# Patient Record
Sex: Female | Born: 1945 | Race: Asian | Hispanic: No | Marital: Single | Smoking: Never smoker
Health system: Southern US, Community
[De-identification: ages and names within clinical notes are randomized; demographics above are authoritative.]

## PROBLEM LIST (undated history)

## (undated) DIAGNOSIS — H269 Unspecified cataract: Secondary | ICD-10-CM

## (undated) DIAGNOSIS — Z86010 Personal history of colonic polyps: Secondary | ICD-10-CM

## (undated) DIAGNOSIS — N200 Calculus of kidney: Secondary | ICD-10-CM

## (undated) DIAGNOSIS — M1990 Unspecified osteoarthritis, unspecified site: Secondary | ICD-10-CM

## (undated) DIAGNOSIS — I10 Essential (primary) hypertension: Secondary | ICD-10-CM

## (undated) DIAGNOSIS — E785 Hyperlipidemia, unspecified: Secondary | ICD-10-CM

## (undated) HISTORY — DX: Personal history of colonic polyps: Z86.010

## (undated) HISTORY — PX: ABDOMINAL HYSTERECTOMY: SHX81

## (undated) HISTORY — DX: Essential (primary) hypertension: I10

## (undated) HISTORY — DX: Unspecified osteoarthritis, unspecified site: M19.90

## (undated) HISTORY — DX: Calculus of kidney: N20.0

## (undated) HISTORY — DX: Hyperlipidemia, unspecified: E78.5

## (undated) HISTORY — DX: Unspecified cataract: H26.9

## (undated) HISTORY — PX: COLONOSCOPY: SHX174

---

## 1998-08-26 ENCOUNTER — Other Ambulatory Visit: Admission: RE | Admit: 1998-08-26 | Discharge: 1998-08-26 | Payer: Self-pay | Admitting: *Deleted

## 2000-03-01 ENCOUNTER — Other Ambulatory Visit: Admission: RE | Admit: 2000-03-01 | Discharge: 2000-03-01 | Payer: Self-pay | Admitting: Obstetrics and Gynecology

## 2000-03-14 ENCOUNTER — Encounter: Payer: Self-pay | Admitting: Obstetrics and Gynecology

## 2000-03-21 ENCOUNTER — Encounter (INDEPENDENT_AMBULATORY_CARE_PROVIDER_SITE_OTHER): Payer: Self-pay

## 2000-03-21 ENCOUNTER — Inpatient Hospital Stay (HOSPITAL_COMMUNITY): Admission: RE | Admit: 2000-03-21 | Discharge: 2000-03-24 | Payer: Self-pay | Admitting: Obstetrics and Gynecology

## 2001-09-11 ENCOUNTER — Other Ambulatory Visit: Admission: RE | Admit: 2001-09-11 | Discharge: 2001-09-11 | Payer: Self-pay | Admitting: Obstetrics and Gynecology

## 2003-01-06 ENCOUNTER — Emergency Department (HOSPITAL_COMMUNITY): Admission: EM | Admit: 2003-01-06 | Discharge: 2003-01-06 | Payer: Self-pay

## 2003-01-06 ENCOUNTER — Encounter: Payer: Self-pay | Admitting: Emergency Medicine

## 2003-01-11 ENCOUNTER — Emergency Department (HOSPITAL_COMMUNITY): Admission: EM | Admit: 2003-01-11 | Discharge: 2003-01-11 | Payer: Self-pay | Admitting: Emergency Medicine

## 2010-08-28 NOTE — Discharge Summary (Signed)
[facility]  Patient:    AUDIANNA, LANDGREN [facility]                         MRN: [id number] Proc. Date: 03/21/00 Adm. Date:  [id number] Attending:  Osborn Coho                           Discharge Summary  DISCHARGE DIAGNOSES: 1. Symptomatic prolapse. 2. Symptomatic cystocele.  PROCEDURE: 1. Total vaginal hysterectomy with bilateral salpingo-oophorectomy. 2. Anterior colporrhaphy.  HISTORY OF PRESENT ILLNESS:  Ms. Kasper is a 65 year old female G5, P2-0-3-2 who is menopausal. The patient presented on March 21, 2000 for a total vaginal hysterectomy, bilateral salpingo-oophorectomy, anterior colporrhaphy secondary to a 4 year history of worsening symptoms of uterine prolapse. Approximately 1 year ago, a pessary was placed; however, this caused extreme discomfort and was discontinued. The patient states that the protruding mass occasionally bleeds as it rubs on her underwear. The patient lifts a lot at work. She denies any symptoms of loss or urine.  HOSPITAL COURSE:  The patient was admitted after the total vaginal hysterectomy with bilateral salpingo-oophorectomy and anterior colporrhaphy. For a complete description of this procedure, please see the dictated operative noted. In the postoperative period, the patient developed constipation which was relieved with Dulcolax and a Fleets enema. The patient remained afebrile throughout the entire hospitalization. She was eating a regular diet at the time of discharge. The patient did have 2 voiding trials which were unsuccessful. The patient having post void residuals of greater than 150 cc. The patient was discharged to home on postoperative day #3. She was sent home with a Foley catheter and plug. She was instructed how to use this. Her son was also instructed since there is some language barrier. The patient will remove the Foley catheter Sunday night and come to the office Monday. The patient was discharged  to home on Demerol to take p.r.n. and Macrobid 1 p.o. b.i.d.  CONDITION ON DISCHARGE:  Stable. DD:  03/24/00 TD:  03/24/00 Job: [id number]

## 2010-08-28 NOTE — Op Note (Signed)
[facility]  Patient:    Theresa Duran, Theresa Duran                             MRN: [id number] Proc. Date: 03/21/00 Attending:  Janeece Riggers. Dareen Piano, M.D.                           Operative Report  PREOPERATIVE DIAGNOSIS:  Symptomatic uterine prolapse with cystocele.  POSTOPERATIVE DIAGNOSIS:  Symptomatic uterine prolapse with cystocele.  OPERATION: 1. Total vaginal hysterectomy with bilateral salpingo-oophorectomy. 2. Anterior colporrhaphy.  SURGEON:  Mark E. Dareen Piano, M.D.  ANESTHESIA:  General endotracheal anesthesia.  ANTIBIOTICS:  Ancef 1 g.  DRAINS:  Foley bedside drainage.  ESTIMATED BLOOD LOSS:  150 cc.  COMPLICATIONS:  None.  SPECIMENS:  Uterus, cervix, fallopian tubes, and ovaries sent to pathology.  DESCRIPTION OF PROCEDURE:  The patient was taken to the operating room where a general anesthetic was administered without complications.  She was then placed in the dorsolithotomy position and prepped with Betadine.  She was draped in the usual fashion for the procedure.  Her bladder was drained with a red rubber catheter.  The weighted speculum was placed in the vagina.  A single-tooth tenaculum was applied to the anterior cervical lip.  The cervix was then injected with 1% lidocaine with epinephrine.  The posterior cul-de-sac was entered sharply.  Peritoneal fluid appeared to be clear.  The uterosacral ligaments were then bilaterally clamped, cut, and ligated with 0 Monocryl suture.  Cardinal ligaments were then serially clamped, cut, and ligated with 0 Monocryl suture.  The anterior cul-de-sac was then entered sharply and intestines were identified.  The uterine vessels were then bilaterally clamped, cut, and ligated with O Monocryl suture.  Once the level of the fallopian tube was reached, the uterus was inverted, and the fallopian tube, ovarian ligament, and round ligament were bilaterally clamped, cut, and the uterus and cervix removed.  At this  point, the ovary was identified.  It appeared to be small and normal.  A clamp was then placed over the fallopian tube, infundibulopelvic ligament, and round ligament.  The ovary and fallopian tube were then removed.  This pedicle was then doubly ligated with 0 Monocryl suture.  A similar procedure was performed on the opposite side.  Next, the cul-de-sac was closed using interrupted 0 Vicryl suture and a McCall colpoplasty.  At this point, the anterior vagina was injected with 1% lidocaine with epinephrine.  A vertical incision was made in the anterior vagina.  The cystocele was then dissected away from the vaginal mucosa.  The cystocele was then pushed back into the abdominal cavity, and interrupted 2-0 Vicryl sutures were used to close the defect.  The excess vagina was then removed.  The vagina was then closed using 2-0 Vicryl in a running locking fashion.  The posterior vagina was closed also using 2-0 Vicryl in a running locking fashion.  This concluded the procedure.  A Foley catheter had been previously placed at the conclusion of the hysterectomy prior to the anterior colporrhaphy.  At this point, the patient was taken to the recovery room. Instrument and lap counts correct x 3. DD:  03/21/00 TD:  03/21/00 Job: [id number]

## 2011-08-18 ENCOUNTER — Encounter: Payer: Self-pay | Admitting: Family Medicine

## 2011-08-18 ENCOUNTER — Ambulatory Visit (INDEPENDENT_AMBULATORY_CARE_PROVIDER_SITE_OTHER): Payer: Medicare HMO | Admitting: Family Medicine

## 2011-08-18 ENCOUNTER — Ambulatory Visit: Payer: Self-pay | Admitting: Family Medicine

## 2011-08-18 VITALS — BP 136/80 | HR 72 | Temp 98.6°F | Ht <= 58 in | Wt 154.5 lb

## 2011-08-18 DIAGNOSIS — R252 Cramp and spasm: Secondary | ICD-10-CM | POA: Insufficient documentation

## 2011-08-18 DIAGNOSIS — Z79899 Other long term (current) drug therapy: Secondary | ICD-10-CM

## 2011-08-18 DIAGNOSIS — E785 Hyperlipidemia, unspecified: Secondary | ICD-10-CM

## 2011-08-18 DIAGNOSIS — H269 Unspecified cataract: Secondary | ICD-10-CM | POA: Insufficient documentation

## 2011-08-18 DIAGNOSIS — M545 Low back pain: Secondary | ICD-10-CM

## 2011-08-18 LAB — CK: Total CK: 119 U/L (ref 7–177)

## 2011-08-18 LAB — CBC
HCT: 41.2 % (ref 36.0–46.0)
Hemoglobin: 13.4 g/dL (ref 12.0–15.0)
MCH: 31.5 pg (ref 26.0–34.0)
MCHC: 32.5 g/dL (ref 30.0–36.0)
MCV: 96.7 fL (ref 78.0–100.0)
Platelets: 252 10*3/uL (ref 150–400)
RBC: 4.26 MIL/uL (ref 3.87–5.11)
RDW: 13.4 % (ref 11.5–15.5)
WBC: 4.2 10*3/uL (ref 4.0–10.5)

## 2011-08-18 LAB — LIPID PANEL
Cholesterol: 152 mg/dL (ref 0–200)
HDL: 49 mg/dL (ref >39)
LDL Cholesterol: 77 mg/dL (ref 0–99)
Total CHOL/HDL Ratio: 3.1 Ratio
Triglycerides: 128 mg/dL (ref <150)
VLDL: 26 mg/dL (ref 0–40)

## 2011-08-18 LAB — TSH: TSH: 2.02 u[IU]/mL (ref 0.350–4.500)

## 2011-08-18 LAB — COMPREHENSIVE METABOLIC PANEL
ALT: 22 U/L (ref 0–35)
AST: 24 U/L (ref 0–37)
Albumin: 4.4 g/dL (ref 3.5–5.2)
Alkaline Phosphatase: 56 U/L (ref 39–117)
BUN: 13 mg/dL (ref 6–23)
CO2: 28 mEq/L (ref 19–32)
Calcium: 9.3 mg/dL (ref 8.4–10.5)
Chloride: 106 mEq/L (ref 96–112)
Creat: 0.52 mg/dL (ref 0.50–1.10)
Glucose, Bld: 83 mg/dL (ref 70–99)
Potassium: 3.8 mEq/L (ref 3.5–5.3)
Sodium: 142 mEq/L (ref 135–145)
Total Bilirubin: 0.7 mg/dL (ref 0.3–1.2)
Total Protein: 6.9 g/dL (ref 6.0–8.3)

## 2011-08-18 LAB — MAGNESIUM: Magnesium: 2.2 mg/dL (ref 1.5–2.5)

## 2011-08-18 LAB — POCT GLYCOSYLATED HEMOGLOBIN (HGB A1C): Hemoglobin A1C: 5.9

## 2011-08-18 NOTE — Progress Notes (Signed)
  Subjective:    Patient ID: Theresa Duran, female    DOB: 1945/08/12, 66 y.o.   MRN: [id number]  HPI new patient to establish care. The patient and daughter agree for the daughter to ask as interpreter as the patient speaks [place] primarily. They refuse telephone interpreter.  1. low back pain. Patient notices some right sided low back pain only after standing for many hours at a time. She denies any injury, fall, weakness, numbness, radiation. She has not tried any therapies or medication.  2. calf cramps. For past couple of months patient notices bilateral calf cramps only at night while sleeping. She denies any pain with ambulation or activity. Denies edema, rash, injury.  3. hyperlipidemia. Patient has been taking simvastatin daily. Cannot remember when she started however this was switched from Lipitor previously (due to cost). This was prescribed by an urgent care physician. No history of heart or lung disease appear. Patient has never smoked.  Review of Systems See history of present illness otherwise negative.    Objective:   Physical Exam  Vitals reviewed. Constitutional: She is oriented to person, place, and time. She appears well-developed and well-nourished. No distress.  HENT:  Head: Normocephalic.  Mouth/Throat: Oropharynx is clear and moist.  Eyes: EOM are normal.  Cardiovascular: Normal rate, regular rhythm, normal heart sounds and intact distal pulses.  Exam reveals no gallop.   No murmur heard. Pulmonary/Chest: Effort normal and breath sounds normal. No respiratory distress. She has no wheezes. She has no rales.  Abdominal: Soft. Bowel sounds are normal. She exhibits no distension. There is no tenderness. There is no guarding.  Musculoskeletal: She exhibits no edema and no tenderness.       No tenderness to palpation and spinous processes. Patient has full range of motion with spinal flexion and extension. She has mild pain with bilateral lateral flexion in lumbar  paraspinal area. No rash or swelling. Negative straight leg test negative FABER.   Full range of motion bilateral hips.  Neurological: She is alert and oriented to person, place, and time. No cranial nerve deficit. She exhibits normal muscle tone. Coordination normal.  Skin: No rash noted.  Psychiatric: She has a normal mood and affect. Her behavior is normal.       Assessment & Plan:

## 2011-08-18 NOTE — Assessment & Plan Note (Signed)
Likely musculoskeletal strain vs early lumbar arthropathy. Recommend trial of conservative management with tylenol, motrin prn. No imaging indicated currently.  Discussed red flag symptoms: increased pain, trauma, swelling, fever, weakness, numbness to trigger return to care. May also try PT if not improved.

## 2011-08-18 NOTE — Patient Instructions (Signed)
I think your back pain and calf cramps are from muscle spasm. You can try to take tylenol (acetaminophen) 650 mg 4 times a day. You can also try motrin (ibuprofen) up to 600mg  3 times a day for up to one week. If your pain worsens, you have weakness, swelling, falls or this does not go away then come back sooner. Will send you for a mammogram. I will call if lab tests are abnormal, or send a letter if everything is ok. You may try to take magnesium for calf cramps.  Make an appointment in 2-3 months for check up.  Cholesterol Cholesterol is a type of fat. Your body needs a small amount of cholesterol, but too much can cause health problems. Certain problems include heart attacks, strokes, and not enough blood flow to your heart, brain, kidneys, or feet. You get cholesterol in 2 ways:  Naturally.   By eating certain foods.  HOME CARE  Eat a low-fat diet:   Eat less eggs, whole dairy products (whole milk, cheese, and butter), fatty meats, and fried foods.   Eat more fruits, vegetables, whole-wheat breads, lean chicken, and fish.   Follow your exercise program as told by your doctor.   Keep your weight at a healthy level. Talk to your doctor about what is right for you.   Only take medicine as told by your doctor.   Get your cholesterol checked once a year or as told by your doctor.  MAKE SURE YOU:  Understand these instructions.   Will watch your condition.   Will get help right away if you are not doing well or get worse.  Document Released: 06/25/2008 Document Revised: 03/18/2011 Document Reviewed: 06/25/2008 [facility] Patient Information 2012 [place], [place].

## 2011-08-18 NOTE — Assessment & Plan Note (Signed)
Will check FLP today. Continue statin for now. Will check CK or consider trial off statin if muscle cramps continue.

## 2011-08-18 NOTE — Assessment & Plan Note (Signed)
Will check electrolytes, CK to rule out myopathy. Advised empiric trial of magnesium. May try off statin.

## 2011-08-19 ENCOUNTER — Encounter: Payer: Self-pay | Admitting: Family Medicine

## 2011-10-25 ENCOUNTER — Encounter: Payer: Self-pay | Admitting: Family Medicine

## 2011-10-25 ENCOUNTER — Ambulatory Visit (INDEPENDENT_AMBULATORY_CARE_PROVIDER_SITE_OTHER): Payer: Medicare HMO | Admitting: Family Medicine

## 2011-10-25 VITALS — BP 138/82 | HR 85 | Temp 98.6°F | Ht <= 58 in | Wt 149.0 lb

## 2011-10-25 DIAGNOSIS — J069 Acute upper respiratory infection, unspecified: Secondary | ICD-10-CM

## 2011-10-25 MED ORDER — HYDROCOD POLST-CHLORPHEN POLST 10-8 MG/5ML PO LQCR: 5.0000 mL | Freq: Two times a day (BID) | ORAL | Status: DC | PRN

## 2011-10-25 MED ORDER — HYDROCODONE-HOMATROPINE 5-1.5 MG/5ML PO SYRP: 5.0000 mL | ORAL_SOLUTION | Freq: Three times a day (TID) | ORAL | Status: AC | PRN

## 2011-10-25 NOTE — Assessment & Plan Note (Signed)
Viral uri.  Rx hycodan cough syrup.  Discussed typical course and red flags for return.

## 2011-10-25 NOTE — Progress Notes (Signed)
  Subjective:    Patient ID: Theresa Duran, female    DOB: November 09, 1945, 66 y.o.   MRN: [id number]  HPI 5 days of cough  Associated with runny nose, headache, sore throat.   Productive yellow-green cough.  No dyspnea.  No fever, chills. Taking OT dextromethorphan without improvement.    I have reviewed patient's  PMH, FH, and Social history and Medications as related to this visit. No smoking, no history of immunocompromise, recent hospitalizations, or lung illnesses. Review of Systems See HPI    Objective:   Physical Exam GEN: Alert & Oriented, No acute distress, here with friend, used phone [place] interpreter HEENT: [place]/AT. EOMI, PERRLA, no conjunctival injection or scleral icterus..  Oropharynx is without erythema or exudates.  No anterior or posterior cervical lymphadenopathy. CV:  Regular Rate & Rhythm, no murmur Respiratory:  Normal work of breathing, CTAB         Assessment & Plan:

## 2011-10-25 NOTE — Progress Notes (Signed)
Subjective:     Patient ID: Theresa Duran, female   DOB: [birth year], 66 y.o.   MRN: [id number]  HPI Theresa Duran is a 66 y/o woman with a history of hyerlipidemia who comes to clinic today with a 5 day history of cough. Visit conducted through a [place] interpreter.   She says the cough started last Wednesday, and has continued since then. She states that she has coughed so much it has given her a headache. When she coughs, she also feels a pain in the middle of her chest. She says that her throat also feels sore.   She tried Nyquil cough syrups, but they have not helped.   She has no other complaints today, and denies fever, shortness of breath, and other associated symptoms.   Review of Systems As per above.     Objective:   Physical Exam General: No acute distress HEENT: MMM, oral mucosa is non-erythematous, there is no exudate. Neck is supple and without lymphadenopathy Pulm: Normal effort of breathing, lungs clear to auscultation bilaterally CV: RRR, no murmurs, rubs, or gallips.     Assessment:  1. 66 y/o woman with a 5 day history of cough. Most likely a viral URI. Absence of fever, systemic symptoms, and exudate make bacterial cause more unlikely.  Plan:  1. Cough: Will give prescription for hydrocodone-homatropine cough syrup for symptom control. Instructed to return if cough does not resolve in the next week or so, or if she develops fever and/or shortness of breath.

## 2011-10-25 NOTE — Patient Instructions (Addendum)
Use cough medicine as needed Return if you have shortness of breath, fever, or other concerns

## 2011-10-26 ENCOUNTER — Telehealth: Payer: Self-pay | Admitting: *Deleted

## 2011-10-26 NOTE — Telephone Encounter (Signed)
Patient notified

## 2011-10-26 NOTE — Telephone Encounter (Signed)
PA denied.  Information regarding denial given to Dr. Earnest Bailey.

## 2011-10-26 NOTE — Telephone Encounter (Signed)
Pharmacy states Hycodan not covered by insurance. I called [employer] and was told PA could be done. Form placed in MD box.

## 2011-10-26 NOTE — Telephone Encounter (Signed)
Please call patient and let her know medicare does not cover any cough medicines.

## 2011-10-28 ENCOUNTER — Telehealth: Payer: Self-pay | Admitting: Family Medicine

## 2011-10-28 NOTE — Telephone Encounter (Signed)
Need to speak with provider regarding cough med that was prescribed for her.  Daughter said that the medicine is not working, mother still coughing all day and all night.  Have a lot of mucous buildup.  Please call her daughter back to discuss other options.

## 2011-10-29 MED ORDER — AZITHROMYCIN 250 MG PO TABS: ORAL_TABLET | ORAL | Status: AC

## 2011-10-29 NOTE — Telephone Encounter (Signed)
Please let patient know i have sent in antibiotic azithromycin for her.

## 2011-10-29 NOTE — Telephone Encounter (Signed)
I have not evaluated patient, will route to Dr. Earnest Bailey for advice.

## 2011-11-08 NOTE — Telephone Encounter (Signed)
Spoke with daughter and appointment is scheduled for tomorrow AM for recheck . Daughter ask that she be called during visit because the daughter that is going to bring her does not speak [place]. Phone # [phone]  . [place]  interpretor has been scheduled.

## 2011-11-08 NOTE — Telephone Encounter (Signed)
Pt finished abx and she was getting better, but now the cough is back and worse than before.  Wants to know what to do.

## 2011-11-09 ENCOUNTER — Encounter: Payer: Self-pay | Admitting: Family Medicine

## 2011-11-09 ENCOUNTER — Ambulatory Visit (INDEPENDENT_AMBULATORY_CARE_PROVIDER_SITE_OTHER): Payer: Medicare HMO | Admitting: Family Medicine

## 2011-11-09 VITALS — BP 158/86 | Temp 98.0°F | Wt 150.0 lb

## 2011-11-09 DIAGNOSIS — R05 Cough: Secondary | ICD-10-CM

## 2011-11-09 MED ORDER — BENZONATATE 100 MG PO CAPS: 100.0000 mg | ORAL_CAPSULE | Freq: Three times a day (TID) | ORAL | Status: DC | PRN

## 2011-11-09 NOTE — Assessment & Plan Note (Signed)
Cough most likely post-viral with an element of post nasal drainage per her history.  Advised antihistamine and nasal saline irrigation.  Will rx tessalon for symptomatic treatment.  Discussed to return for dyspnea, fever or prolonged for 6 weeks to return for further evaluation for chronic cough at that point.

## 2011-11-09 NOTE — Progress Notes (Signed)
  Subjective:    Patient ID: Theresa Duran, female    DOB: 1946-01-11, 66 y.o.   MRN: [id number]  HPI  Follow-up for continued cough.  Was seen on 7/15 for 6 days of cough.  Was rxed hydrocodone cough syrup.  Did not helped.  Then rxed empiric azithromycin.  Continued coughing.  occ mucous.  No fever, chills, dyspnea.  No improvement. Feels a tickle in her throat.  ? Some drainage.  No reflux of sour taste in mouth.  No itching sneezing.  I have reviewed patient's  PMH, FH, and Social history and Medications as related to this visit.  Review of Systems ssee hPI    Objective:   Physical Exam GEN: Alert & Oriented, No acute distress, [place] interpretr in room CV:  Regular Rate & Rhythm, no murmur Respiratory:  Normal work of breathing, CTAB Abd:  + BS, soft, no tenderness to palpation Ext: no pre-tibial edema        Assessment & Plan:

## 2011-11-09 NOTE — Patient Instructions (Addendum)
Your cough is leftover cough from your cold  You do not have signs of pneumonia  Tessalon is a cough medicine, may not be covered by medicare  Most coughs will resolve by 6 weeks, if yours continues, please come back for recheck  Drainage in back of throat can cause tickel and cough:  Try allergy medicine such as zyrtec or claritin  Can try sinus rinse such as neti pot or neilmed sinus rinse. (kits available at drugstore)

## 2011-12-27 ENCOUNTER — Other Ambulatory Visit: Payer: Self-pay | Admitting: Family Medicine

## 2011-12-28 ENCOUNTER — Ambulatory Visit (HOSPITAL_COMMUNITY)
Admission: RE | Admit: 2011-12-28 | Discharge: 2011-12-28 | Disposition: A | Discharge status: Home / Self Care | Payer: Medicare HMO | Source: Ambulatory Visit | Attending: Family Medicine | Admitting: Family Medicine

## 2011-12-28 ENCOUNTER — Ambulatory Visit (INDEPENDENT_AMBULATORY_CARE_PROVIDER_SITE_OTHER): Payer: Medicare HMO | Admitting: Family Medicine

## 2011-12-28 VITALS — BP 152/85 | HR 85 | Temp 98.1°F | Wt 147.0 lb

## 2011-12-28 DIAGNOSIS — M7989 Other specified soft tissue disorders: Secondary | ICD-10-CM | POA: Insufficient documentation

## 2011-12-28 DIAGNOSIS — R82998 Other abnormal findings in urine: Secondary | ICD-10-CM

## 2011-12-28 DIAGNOSIS — M25562 Pain in left knee: Secondary | ICD-10-CM

## 2011-12-28 DIAGNOSIS — M1712 Unilateral primary osteoarthritis, left knee: Secondary | ICD-10-CM | POA: Insufficient documentation

## 2011-12-28 DIAGNOSIS — M25569 Pain in unspecified knee: Secondary | ICD-10-CM

## 2011-12-28 DIAGNOSIS — R300 Dysuria: Secondary | ICD-10-CM

## 2011-12-28 LAB — POCT URINALYSIS DIPSTICK
Protein, UA: 30
pH, UA: 6

## 2011-12-28 LAB — POCT SEDIMENTATION RATE: POCT SED RATE: 24 mm/hr — AB (ref 0–22)

## 2011-12-28 LAB — POCT UA - MICROSCOPIC ONLY

## 2011-12-28 LAB — URIC ACID: Uric Acid, Serum: 5.9 mg/dL (ref 2.4–7.0)

## 2011-12-28 MED ORDER — DICLOFENAC SODIUM 75 MG PO TBEC: 75.0000 mg | DELAYED_RELEASE_TABLET | Freq: Two times a day (BID) | ORAL | Status: DC

## 2011-12-28 NOTE — Assessment & Plan Note (Signed)
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 Unilateral pain concerning for possible gouty arthritis. rheumatoid arthritis unlikely in the setting of monoarticular arthritis with no systemic complaints.  P 1. L knee  x-ray 2. Start diclofenac one tab twice daily to reduce inflammation: up to two weeks. Drink plenty of water with this.  3. Labs uric acid and sed rate.  4. ice and elevation when resting.  5. If x-ray confirm arthritis I recommend steroid injection this treatment option was discussed with the patient.

## 2011-12-28 NOTE — Assessment & Plan Note (Signed)
A: calcium oxalate crystals in urine. This is concerning for the potential for stones. No symptoms concerning for UTI.  P: -liberal hydration -advised patient to contact us if she develop urinary symptoms or flank pain.

## 2011-12-28 NOTE — Progress Notes (Signed)
Subjective:     Patient ID: Theresa Duran, female   DOB: 1945/04/21, 66 y.o.   MRN: [id number]  HPI History obtained from patient with help of interpreter. Patient spoke some English during the exam.   # L knee pain: x 2 weeks. No inciting injury. Has had similar pain before but not as bad. Pain in lateral upper knee and medial lower knee with intermittent swelling. No redness. Aggravating factors included prolonged standing > 1 hr, walking long distances, knee flexion (walking up stairs). Relieving factors rest only. Patient tried heat and tylenol without relief. No pain in R knee.   # yellow urine: noticed yellow urine. Drinks plenty of water. Denies dysuria, frequency and urgency and incontinence. Denies flank pain and fever.   Review of Systems As per HPI     Objective:   Physical Exam BP 152/85  Pulse 85  Temp 98.1 F (36.7 C) (Oral)  Wt 147 lb (66.679 kg) General appearance: alert, cooperative and no distress Extremities:  Knee: Normal to inspection with no erythema or effusion or obvious bony abnormalities. Palpation positive for superior lateral and inferior medial condyle and joint line tenderness. No warmth or patellar tenderness. ROM normal in flexion and extension and lower leg rotation. Ligaments with solid consistent endpoints including ACL, PCL, LCL, MCL. Negative  provocative meniscal tests. Non painful patellar compression. Patellar and quadriceps tendons unremarkable. Hamstring and quadriceps strength is normal. Patient able to squat with pain.  Gait with slight limp.     Assessment and Plan:

## 2011-12-28 NOTE — Patient Instructions (Addendum)
Theresa Duran,  Thank you for coming in to see me today.  For your knee pain: 1. Go for x-rays 2. Start diclofenac one tab twice daily to reduce inflammation: up to two weeks. Drink plenty of water with this.  3. I will send blood work results.  4. Try ice and elevation when rest.    For dark urine. There are crystal in your urine.  1. Drink plenty of water. 8 oz 8 time per days.  2. Please f/u if you develop pain in side or pain with urination.  F/u with Dr. Cristal Ford in two weeks.   Dr. Armen Pickup

## 2012-01-12 ENCOUNTER — Telehealth: Payer: Self-pay | Admitting: Family Medicine

## 2012-01-12 NOTE — Telephone Encounter (Signed)
Daughter is calling to let Dr. Cristal Ford know that the pain medicine for the patients knee pain is not helping her at all.  The patient is scheduled to be seen this Friday, 10/4, and her daughter wanted to make Dr. Cristal Ford aware of this prior to the appt, that the daughter will not be coming to.

## 2012-01-14 ENCOUNTER — Encounter: Payer: Self-pay | Admitting: Family Medicine

## 2012-01-14 ENCOUNTER — Ambulatory Visit (INDEPENDENT_AMBULATORY_CARE_PROVIDER_SITE_OTHER): Payer: Medicare HMO | Admitting: Family Medicine

## 2012-01-14 VITALS — BP 173/97 | HR 95 | Temp 98.1°F | Ht <= 58 in | Wt 151.0 lb

## 2012-01-14 DIAGNOSIS — M25562 Pain in left knee: Secondary | ICD-10-CM

## 2012-01-14 DIAGNOSIS — I10 Essential (primary) hypertension: Secondary | ICD-10-CM | POA: Insufficient documentation

## 2012-01-14 DIAGNOSIS — M25569 Pain in unspecified knee: Secondary | ICD-10-CM

## 2012-01-14 DIAGNOSIS — R030 Elevated blood-pressure reading, without diagnosis of hypertension: Secondary | ICD-10-CM

## 2012-01-14 MED ORDER — TRAMADOL HCL 50 MG PO TABS: 50.0000 mg | ORAL_TABLET | Freq: Three times a day (TID) | ORAL | Status: DC | PRN

## 2012-01-14 NOTE — Assessment & Plan Note (Signed)
Discussed importance of f/u after off NSAIDs and hopefully pain improved.

## 2012-01-14 NOTE — Progress Notes (Signed)
  Subjective:    Patient ID: Theresa Duran, female    DOB: 1945-06-05, 66 y.o.   MRN: [id number]  HPI  1. Left knee pain. F/u today. Still having intermittent left knee pain and swelling. This improves with rest and elevation. Feels pain with standing or walking for long periods. Rated 7/10 at worst. The voltaren helps mildly but fears it has raised her BP some. I reviewed her labs and plain films showing normal uric acid, CBC, ESR and tricompartmental spurring including patellar (where pain is the worst). She denies any recent falls or injuries. Pain worst at lateral patellar region and medial joint line. No effusion today because she has been resting.  Denies fever, chills, redness, calf pain, other joint involvement.   No history of knee surgeries.  Review of Systems See HPI otherwise negative.  reports that she has never smoked. She does not have any smokeless tobacco history on file.    Objective:   Physical Exam  Constitutional: She is oriented to person, place, and time. She appears well-developed and well-nourished. No distress.  HENT:  Head: Normocephalic and atraumatic.  Eyes: EOM are normal.  Cardiovascular: Normal rate, regular rhythm and normal heart sounds.   No murmur heard. Pulmonary/Chest: Effort normal and breath sounds normal. No respiratory distress. She has no wheezes.  Musculoskeletal:       No effusion apparent. Full extension ROM bilaterally. -10 degree left knee flexion deficit. Patella has decreased mobility. Medial joint line TTP and suprapatellar tenderness.  No redness or warmth.   Neurological: She is alert and oriented to person, place, and time.       Gait mildly antalgic.  Skin: She is not diaphoretic.  Psychiatric: She has a normal mood and affect.        Assessment & Plan:

## 2012-01-14 NOTE — Patient Instructions (Addendum)
Please stop taking voltaren. This may have made the blood pressure higher. You can take tylenol (acetaminophen) up to 4 times daily. You can try tramadol as needed. Will send to pharmacy. Ice the knee for next 2 days. Please return for check up in 2-3 weeks for blood pressure check, or sooner if needed. Continue walking and activities as much as possible.  Knee Injection Joint injections are shots. Your caregiver will place a needle into your knee joint. The needle is used to put medicine into the joint. These shots can be used to help treat different painful knee conditions such as osteoarthritis, bursitis, local flare-ups of rheumatoid arthritis, and pseudogout. Anti-inflammatory medicines such as corticosteroids and anesthetics are the most common medicines used for joint and soft tissue injections.  PROCEDURE  The skin over the kneecap will be cleaned with an antiseptic solution.  Your caregiver will inject a small amount of a local anesthetic (a medicine like Novocaine) just under the skin in the area that was cleaned.  After the area becomes numb, a second injection is done. This second injection usually includes an anesthetic and an anti-inflammatory medicine called a steroid or cortisone. The needle is carefully placed in between the kneecap and the knee, and the medicine is injected into the joint space.  After the injection is done, the needle is removed. Your caregiver may place a bandage over the injection site. The whole procedure takes no more than a couple of minutes. BEFORE THE PROCEDURE  Wash all of the skin around the entire knee area. Try to remove any loose, scaling skin. There is no other specific preparation necessary unless advised otherwise by your caregiver. LET YOUR CAREGIVER KNOW ABOUT:   Allergies.  Medications taken including herbs, eye drops, over the counter medications, and creams.  Use of steroids (by mouth or creams).  Possible pregnancy, if  applicable.  Previous problems with anesthetics or Novocaine.  History of blood clots (thrombophlebitis).  History of bleeding or blood problems.  Previous surgery.  Other health problems. RISKS AND COMPLICATIONS Side effects from cortisone shots are rare. They include:   Slight bruising of the skin.  Shrinkage of the normal fatty tissue under the skin where the shot was given.  Increase in pain after the shot.  Infection.  Weakening of tendons or tendon rupture.  Allergic reaction to the medicine.  Diabetics may have a temporary increase in their blood sugar after a shot.  Cortisone can temporarily weaken the immune system. While receiving these shots, you should not get certain vaccines. Also, avoid contact with anyone who has chickenpox or measles. Especially if you have never had these diseases or have not been previously immunized. Your immune system may not be strong enough to fight off the infection while the cortisone is in your system. AFTER THE PROCEDURE   You can go home after the procedure.  You may need to put ice on the joint 15 to 20 minutes every 3 or 4 hours until the pain goes away.  You may need to put an elastic bandage on the joint. HOME CARE INSTRUCTIONS   Only take over-the-counter or prescription medicines for pain, discomfort, or fever as directed by your caregiver.  You should avoid stressing the joint. Unless advised otherwise, avoid activities that put a lot of pressure on a knee joint, such as:  Jogging.  Bicycling.  Recreational climbing.  Hiking.  Laying down and elevating the leg/knee above the level of your heart can help to minimize swelling.  SEEK MEDICAL CARE IF:   You have repeated or worsening swelling.  There is drainage from the puncture area.  You develop red streaking that extends above or below the site where the needle was inserted. SEEK IMMEDIATE MEDICAL CARE IF:   You develop a fever.  You have pain that gets  worse even though you are taking pain medicine.  The area is red and warm, and you have trouble moving the joint. MAKE SURE YOU:   Understand these instructions.  Will watch your condition.  Will get help right away if you are not doing well or get worse. Document Released: 06/20/2006 Document Revised: 06/21/2011 Document Reviewed: 03/17/2007 [facility] Patient Information 2013 [place], [place].

## 2012-01-14 NOTE — Assessment & Plan Note (Signed)
Most likely this is osteoarthritis with waxing and waning symptoms. No red flags or lab workup to suggest infectious or inflammatory arthritis today in absence of significant effusion. Performed medrol knee injection. Advised she stop voltaren and restart tylenol and sent new tramadol prn for pain. Advised to f/u in 2 weeks for BP recheck.    After written consent was obtained, using sterile technique the left knee was prepped with betadine and alcohol.  The knee joint entered with a 22 gauge from the medial infrapatellar approach. Medrol 40 mg and 4 ml plain Lidocaine was then injected and the needle withdrawn.  The procedure was well tolerated.  The patient is asked to continue to rest the knee for a few more days before resuming regular activities.  It may be more painful for the first 1-2 days.  Watch for fever, or increased swelling or persistent pain in knee. Call or return to clinic prn if such symptoms occur or the knee fails to improve as anticipated.

## 2012-01-28 ENCOUNTER — Ambulatory Visit: Payer: Medicare HMO | Admitting: Family Medicine

## 2012-04-13 ENCOUNTER — Other Ambulatory Visit: Payer: Self-pay | Admitting: Physician Assistant

## 2012-04-14 NOTE — Telephone Encounter (Signed)
Chart pulled to PA pool at nurses station [phone]

## 2012-04-17 ENCOUNTER — Other Ambulatory Visit: Payer: Self-pay | Admitting: Family Medicine

## 2012-04-17 ENCOUNTER — Telehealth: Payer: Self-pay | Admitting: Family Medicine

## 2012-04-17 MED ORDER — SIMVASTATIN 40 MG PO TABS: 40.0000 mg | ORAL_TABLET | Freq: Every evening | ORAL | Status: DC

## 2012-04-17 NOTE — Telephone Encounter (Signed)
Request sent to Dr. Tye Savoy.

## 2012-04-17 NOTE — Telephone Encounter (Signed)
Sent in

## 2012-04-17 NOTE — Telephone Encounter (Signed)
Is now out of her simvastatin and pharmacy sent request to [facility] instead of here last week.  Wants to know if this can be called in.  [facility]- [place]

## 2012-05-02 ENCOUNTER — Telehealth: Payer: Self-pay | Admitting: Family Medicine

## 2012-05-02 NOTE — Telephone Encounter (Signed)
I do not even have synthroid or hypothyroidism on her problem list or history. This is a [facility]. PLease have them schedule an appointment prior to getting refills.

## 2012-05-02 NOTE — Telephone Encounter (Signed)
Pt is asking to change her synthroid amount to 90 at a time - insurance will pay better for this

## 2012-05-03 ENCOUNTER — Other Ambulatory Visit: Payer: Self-pay | Admitting: Family Medicine

## 2012-05-03 MED ORDER — SIMVASTATIN 40 MG PO TABS: 40.0000 mg | ORAL_TABLET | Freq: Every evening | ORAL | Status: DC

## 2012-05-03 NOTE — Telephone Encounter (Signed)
I am sorry - I am mistaken.  It was simvastatin not synthroid!!  Sorry!

## 2012-05-03 NOTE — Telephone Encounter (Signed)
I have sent 90 day supply of simvastatin to [facility].

## 2012-09-10 ENCOUNTER — Other Ambulatory Visit: Payer: Self-pay | Admitting: Family Medicine

## 2012-12-12 ENCOUNTER — Ambulatory Visit (INDEPENDENT_AMBULATORY_CARE_PROVIDER_SITE_OTHER): Payer: Medicare HMO | Admitting: Family Medicine

## 2012-12-12 ENCOUNTER — Encounter: Payer: Self-pay | Admitting: Family Medicine

## 2012-12-12 ENCOUNTER — Ambulatory Visit (HOSPITAL_COMMUNITY)
Admission: RE | Admit: 2012-12-12 | Discharge: 2012-12-12 | Disposition: A | Discharge status: Home / Self Care | Payer: Medicare HMO | Source: Ambulatory Visit | Attending: Family Medicine | Admitting: Family Medicine

## 2012-12-12 VITALS — BP 152/82 | HR 86 | Temp 98.6°F | Ht <= 58 in | Wt 149.0 lb

## 2012-12-12 DIAGNOSIS — M48061 Spinal stenosis, lumbar region without neurogenic claudication: Secondary | ICD-10-CM

## 2012-12-12 DIAGNOSIS — M25562 Pain in left knee: Secondary | ICD-10-CM

## 2012-12-12 DIAGNOSIS — M25569 Pain in unspecified knee: Secondary | ICD-10-CM

## 2012-12-12 DIAGNOSIS — Z0000 Encounter for general adult medical examination without abnormal findings: Secondary | ICD-10-CM

## 2012-12-12 DIAGNOSIS — M549 Dorsalgia, unspecified: Secondary | ICD-10-CM

## 2012-12-12 DIAGNOSIS — E785 Hyperlipidemia, unspecified: Secondary | ICD-10-CM

## 2012-12-12 DIAGNOSIS — M545 Low back pain, unspecified: Secondary | ICD-10-CM | POA: Insufficient documentation

## 2012-12-12 MED ORDER — SIMVASTATIN 40 MG PO TABS: 40.0000 mg | ORAL_TABLET | Freq: Every evening | ORAL | Status: DC

## 2012-12-12 MED ORDER — TRAMADOL HCL 50 MG PO TABS: 50.0000 mg | ORAL_TABLET | Freq: Four times a day (QID) | ORAL | Status: DC | PRN

## 2012-12-12 MED ORDER — ACETAMINOPHEN 500 MG PO TABS: 500.0000 mg | ORAL_TABLET | Freq: Four times a day (QID) | ORAL | Status: DC | PRN

## 2012-12-12 NOTE — Patient Instructions (Addendum)
It was nice to meet you Go to the hospital to get an x-ray of your back You will also get a study called a DEXA scan which looks for weak bones, or osteoporosis Take tylenol 500mg  every 6 hours for pain Also take Ultram (tramadol) 50mg  every 8 hours as needed for pain.  Return to clinic for a physical in 3 months.       Back Pain, Adult Low back pain is very common. About 1 in 5 people have back pain.The cause of low back pain is rarely dangerous. The pain often gets better over time.About half of people with a sudden onset of back pain feel better in just 2 weeks. About 8 in 10 people feel better by 6 weeks.  CAUSES Some common causes of back pain include:  Strain of the muscles or ligaments supporting the spine.  Wear and tear (degeneration) of the spinal discs.  Arthritis.  Direct injury to the back. DIAGNOSIS Most of the time, the direct cause of low back pain is not known.However, back pain can be treated effectively even when the exact cause of the pain is unknown.Answering your caregiver's questions about your overall health and symptoms is one of the most accurate ways to make sure the cause of your pain is not dangerous. If your caregiver needs more information, he or she may order lab work or imaging tests (X-rays or MRIs).However, even if imaging tests show changes in your back, this usually does not require surgery. HOME CARE INSTRUCTIONS For many people, back pain returns.Since low back pain is rarely dangerous, it is often a condition that people can learn to [facility] their own.   Remain active. It is stressful on the back to sit or stand in one place. Do not sit, drive, or stand in one place for more than 30 minutes at a time. Take short walks on level surfaces as soon as pain allows.Try to increase the length of time you walk each day.  Do not stay in bed.Resting more than 1 or 2 days can delay your recovery.  Do not avoid exercise or work.Your body is made  to move.It is not dangerous to be active, even though your back may hurt.Your back will likely heal faster if you return to being active before your pain is gone.  Pay attention to your body when you bend and lift. Many people have less discomfortwhen lifting if they bend their knees, keep the load close to their bodies,and avoid twisting. Often, the most comfortable positions are those that put less stress on your recovering back.  Find a comfortable position to sleep. Use a firm mattress and lie on your side with your knees slightly bent. If you lie on your back, put a pillow under your knees.  Only take over-the-counter or prescription medicines as directed by your caregiver. Over-the-counter medicines to reduce pain and inflammation are often the most helpful.Your caregiver may prescribe muscle relaxant drugs.These medicines help dull your pain so you can more quickly return to your normal activities and healthy exercise.  Put ice on the injured area.  Put ice in a plastic bag.  Place a towel between your skin and the bag.  Leave the ice on for 15-20 minutes, 3-4 times a day for the first 2 to 3 days. After that, ice and heat may be alternated to reduce pain and spasms.  Ask your caregiver about trying back exercises and gentle massage. This may be of some benefit.  Avoid feeling anxious  or stressed.Stress increases muscle tension and can worsen back pain.It is important to recognize when you are anxious or stressed and learn ways to manage it.Exercise is a great option. SEEK MEDICAL CARE IF:  You have pain that is not relieved with rest or medicine.  You have pain that does not improve in 1 week.  You have new symptoms.  You are generally not feeling well. SEEK IMMEDIATE MEDICAL CARE IF:   You have pain that radiates from your back into your legs.  You develop new bowel or bladder control problems.  You have unusual weakness or numbness in your arms or legs.  You  develop nausea or vomiting.  You develop abdominal pain.  You feel faint.

## 2012-12-12 NOTE — Progress Notes (Signed)
  Subjective:    Theresa Duran is a 67 y.o. female who presents for evaluation of low back pain. 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 Symptoms have been present for several years and are unchanged.  Onset was related to / precipitated by no known injury. The pain is located in the across the lower back or radiating to bilateral leg(s) and radiates to the right lower leg, left lower leg. The pain is described as stabbing and tingling and occurs intermittently. She rates her pain as moderate. Symptoms are exacerbated by standing, twisting and walking for more than 30 minutes. Symptoms are improved by acetaminophen. She has also tried heat and ice which provided no symptom relief. She has no other symptoms associated with the back pain. The patient has no "red flag" history indicative of complicated back pain.  She denies fevers, chills, weight loss, weakness, falls. Reports occasional joint pain in hands bilaterally. She still walks everyday for exercise.   The following portions of the patient's history were reviewed and updated as appropriate: allergies, current medications, past family history, past medical history, past social history, past surgical history and problem list.  Review of Systems Pertinent items are noted in HPI.    Objective:   Full range of motion without pain, no tenderness, no spasm, no curvature. Normal reflexes, gait, strength and negative straight-leg raise. Inspection and palpation: inspection of back is normal, no tenderness noted. Muscle tone and ROM exam: limited range of motion with pain. Straight leg raise: negative at 90 degrees bilaterally. Neurological: normal DTRs, muscle strength and reflexes, preserved sensation on feet bilaterally to monofilament testing.    Assessment:  Theresa Duran is a 67 year old female here for back and leg pain.    Spinal stenosis    Plan:     Natural history and expected course discussed. Questions answered. Stretching exercises discussed. OTC analgesics as needed. Follow-up in 3 months.  Diagnostic XRay complete L-spine Ultram 50mg  q8h prn pain  DEXA scan as patient is >65 without risk factors

## 2013-01-22 ENCOUNTER — Telehealth: Payer: Self-pay | Admitting: Family Medicine

## 2013-01-22 NOTE — Telephone Encounter (Signed)
Daughter called and would like to know if her mother needs to have any cancer screenings? Her mother no longer gets her period and she just wants her mother to get proper care. JW

## 2013-01-31 ENCOUNTER — Ambulatory Visit (INDEPENDENT_AMBULATORY_CARE_PROVIDER_SITE_OTHER): Payer: Medicare HMO | Admitting: *Deleted

## 2013-01-31 DIAGNOSIS — Z23 Encounter for immunization: Secondary | ICD-10-CM

## 2013-03-02 ENCOUNTER — Ambulatory Visit (INDEPENDENT_AMBULATORY_CARE_PROVIDER_SITE_OTHER): Payer: Medicare HMO | Admitting: Family Medicine

## 2013-03-02 ENCOUNTER — Encounter: Payer: Self-pay | Admitting: Family Medicine

## 2013-03-02 VITALS — BP 150/88 | HR 93 | Temp 97.8°F | Ht <= 58 in | Wt 151.7 lb

## 2013-03-02 DIAGNOSIS — K219 Gastro-esophageal reflux disease without esophagitis: Secondary | ICD-10-CM

## 2013-03-02 DIAGNOSIS — Z131 Encounter for screening for diabetes mellitus: Secondary | ICD-10-CM

## 2013-03-02 DIAGNOSIS — Z23 Encounter for immunization: Secondary | ICD-10-CM

## 2013-03-02 DIAGNOSIS — Z0000 Encounter for general adult medical examination without abnormal findings: Secondary | ICD-10-CM

## 2013-03-02 DIAGNOSIS — E785 Hyperlipidemia, unspecified: Secondary | ICD-10-CM

## 2013-03-02 DIAGNOSIS — I10 Essential (primary) hypertension: Secondary | ICD-10-CM

## 2013-03-02 LAB — BASIC METABOLIC PANEL
BUN: 10 mg/dL (ref 6–23)
Chloride: 103 mEq/L (ref 96–112)
Creat: 0.56 mg/dL (ref 0.50–1.10)
Potassium: 3.3 mEq/L — ABNORMAL LOW (ref 3.5–5.3)
Sodium: 142 mEq/L (ref 135–145)

## 2013-03-02 LAB — CBC
Hemoglobin: 13.9 g/dL (ref 12.0–15.0)
MCHC: 34.2 g/dL (ref 30.0–36.0)
Platelets: 267 10*3/uL (ref 150–400)
RDW: 13.6 % (ref 11.5–15.5)
WBC: 5.8 10*3/uL (ref 4.0–10.5)

## 2013-03-02 LAB — POCT GLYCOSYLATED HEMOGLOBIN (HGB A1C): Hemoglobin A1C: 5.4

## 2013-03-02 LAB — LDL CHOLESTEROL, DIRECT: Direct LDL: 80 mg/dL

## 2013-03-02 MED ORDER — HYDROCHLOROTHIAZIDE 25 MG PO TABS: 25.0000 mg | ORAL_TABLET | Freq: Every day | ORAL | Status: DC

## 2013-03-02 MED ORDER — OMEPRAZOLE 20 MG PO CPDR: 20.0000 mg | DELAYED_RELEASE_CAPSULE | Freq: Every day | ORAL | Status: DC

## 2013-03-02 MED ORDER — SIMVASTATIN 40 MG PO TABS: 40.0000 mg | ORAL_TABLET | Freq: Every evening | ORAL | Status: DC

## 2013-03-02 NOTE — Progress Notes (Signed)
  Subjective:     Theresa Duran is a 67 y.o. female and is here for a comprehensive physical exam. The patient reports no problems acutely. Still with some chronic bilateral knee pain that is unchanged.  [place] interpreter is present for this office visit.   History   Social History  . Marital Status: Single    Spouse Name: N/A    Number of Children: N/A  . Years of Education: N/A   Occupational History  . Not on file.   Social History Main Topics  . Smoking status: Never Smoker   . Smokeless tobacco: Not on file  . Alcohol Use: No  . Drug Use: No  . Sexual Activity: No   Other Topics Concern  . Not on file   Social History Narrative   Lives with daughter Theresa Duran, speaks vietnamese. Retired and single.   Health Maintenance  Topic Date Due  . Tetanus/tdap  05/27/1965  . Colonoscopy  05/27/1996  . Zostavax  05/27/2006  . Pneumococcal Polysaccharide Vaccine Age 30 And Over  05/28/2011  . Mammogram  08/17/2013  . Influenza Vaccine  11/10/2013    The following portions of the patient's history were reviewed and updated as appropriate: allergies, current medications, past family history, past medical history, past social history, past surgical history and problem list.  Review of Systems Pertinent items are noted in HPI.   Objective:     BP 150/88  Pulse 93  Temp(Src) 97.8 F (36.6 C) (Oral)  Ht 4' 9.5" (1.461 m)  Wt 151 lb 11.2 oz (68.811 kg)  BMI 32.24 kg/m2 Gen: Well-appearing, obese, 67 y.o.female in NAD HEENT: PERRL, MMM, posterior oropharynx clear, good dentition Pulm: Non-labored; CTAB, no wheezes  CV: Regular rate, no murmur; distal pulses intact/symmetric; No LE edema GI: Normoactive BS; soft, non-tender, non-distended, no HSM Skin: No rashes, wounds, ulcers MSK: Normal gait and station; no digital clubbing/cyanosis; knee with crepitus Neuro: CN II-XII without deficits, sensation intact to light touch Psych: A&Ox3, mood good with reactive  affect, speech clear  Assessment:    Healthy female exam. Needs healthcare maintenance.      Plan:    Gave pneumococcal vaccine today, refused TDaP.  Colonoscopy referral made. Pap smear not necessary given h/o hysterectomy for non-cancer indication.  Start HCTZ for consistently elevated BP.  Reorder simvastatin, though pt reports not taking this.  CBC, BMP, LDL (pt not fasting), Hb A1c  See After Visit Summary for Counseling Recommendations

## 2013-03-02 NOTE — Patient Instructions (Signed)
Thank you for coming in today!  We are checking some labs today, and I will call you if they are abnormal.  Please call to schedule your colonoscopy, it is very important to your health.  We are starting a medicine for blood pressure called HCTZ. Take this as directed, it usually has no side effects.   As you leave, make an appointment to follow up with me in 3 months.  - Bring all medications in a bag to your visits.   Take care and seek immediate care sooner than 3 months if you develop any concerns.  Please feel free to call with any questions or concerns at any time, at [phone]. - Dr. Jarvis Newcomer

## 2013-03-06 ENCOUNTER — Encounter: Payer: Self-pay | Admitting: Internal Medicine

## 2013-04-30 ENCOUNTER — Telehealth: Payer: Self-pay

## 2013-04-30 ENCOUNTER — Ambulatory Visit (AMBULATORY_SURGERY_CENTER): Payer: Self-pay

## 2013-04-30 VITALS — Ht <= 58 in | Wt 155.6 lb

## 2013-04-30 DIAGNOSIS — Z1211 Encounter for screening for malignant neoplasm of colon: Secondary | ICD-10-CM

## 2013-04-30 MED ORDER — SUPREP BOWEL PREP KIT 17.5-3.13-1.6 GM/177ML PO SOLN: 1.0000 | Freq: Once | ORAL | Status: DC

## 2013-04-30 NOTE — Telephone Encounter (Signed)
Pt wanted to make you aware that she "always" has problems with N/V with sedation (not just general anesthesia according to her daughter).

## 2013-05-14 ENCOUNTER — Encounter: Payer: Self-pay | Admitting: Internal Medicine

## 2013-05-14 ENCOUNTER — Ambulatory Visit (AMBULATORY_SURGERY_CENTER): Payer: Medicare HMO | Admitting: Internal Medicine

## 2013-05-14 VITALS — BP 132/78 | HR 79 | Temp 97.0°F | Resp 18 | Ht 59.5 in | Wt 155.0 lb

## 2013-05-14 DIAGNOSIS — Z860100 Personal history of colon polyps, unspecified: Secondary | ICD-10-CM | POA: Insufficient documentation

## 2013-05-14 DIAGNOSIS — D126 Benign neoplasm of colon, unspecified: Secondary | ICD-10-CM

## 2013-05-14 DIAGNOSIS — Z1211 Encounter for screening for malignant neoplasm of colon: Secondary | ICD-10-CM

## 2013-05-14 HISTORY — DX: Personal history of colon polyps, unspecified: Z86.0100

## 2013-05-14 HISTORY — DX: Personal history of colonic polyps: Z86.010

## 2013-05-14 MED ORDER — SODIUM CHLORIDE 0.9 % IV SOLN: 500.0000 mL | INTRAVENOUS | Status: DC

## 2013-05-14 NOTE — Patient Instructions (Addendum)
I found and removed 3 tiny polyps - no cancer seen. As you know you also have hemorrhoids.  I will let you know pathology results and when to have another routine colonoscopy by mail.  I appreciate the opportunity to care for you. Gatha Mayer, MD, FACG   YOU HAD AN ENDOSCOPIC PROCEDURE TODAY AT [facility]: Refer to the procedure report that was given to you for any specific questions about what was found during the examination.  If the procedure report does not answer your questions, please call your gastroenterologist to clarify.  If you requested that your care partner not be given the details of your procedure findings, then the procedure report has been included in a sealed envelope for you to review at your convenience later.  YOU SHOULD EXPECT: Some feelings of bloating in the abdomen. Passage of more gas than usual.  Walking can help get rid of the air that was put into your GI tract during the procedure and reduce the bloating. If you had a lower endoscopy (such as a colonoscopy or flexible sigmoidoscopy) you may notice spotting of blood in your stool or on the toilet paper. If you underwent a bowel prep for your procedure, then you may not have a normal bowel movement for a few days.  DIET: Your first meal following the procedure should be a light meal and then it is ok to progress to your normal diet.  A half-sandwich or bowl of soup is an example of a good first meal.  Heavy or fried foods are harder to digest and may make you feel nauseous or bloated.  Likewise meals heavy in dairy and vegetables can cause extra gas to form and this can also increase the bloating.  Drink plenty of fluids but you should avoid alcoholic beverages for 24 hours.  ACTIVITY: Your care partner should take you home directly after the procedure.  You should plan to take it easy, moving slowly for the rest of the day.  You can resume normal activity the day after the procedure however you  should NOT DRIVE or use heavy machinery for 24 hours (because of the sedation medicines used during the test).    SYMPTOMS TO REPORT IMMEDIATELY: A gastroenterologist can be reached at any hour.  During normal business hours, 8:30 AM to 5:00 PM Monday through Friday, call [phone].  After hours and on weekends, please call the GI answering service at [phone] who will take a message and have the physician on call contact you.   Following lower endoscopy (colonoscopy or flexible sigmoidoscopy):  Excessive amounts of blood in the stool  Significant tenderness or worsening of abdominal pains  Swelling of the abdomen that is new, acute  Fever of 100F or higher  Following upper endoscopy (EGD)  Vomiting of blood or coffee ground material  New chest pain or pain under the shoulder blades  Painful or persistently difficult swallowing  New shortness of breath  Fever of 100F or higher  Black, tarry-looking stools  FOLLOW UP: If any biopsies were taken you will be contacted by phone or by letter within the next 1-3 weeks.  Call your gastroenterologist if you have not heard about the biopsies in 3 weeks.  Our staff will call the home number listed on your records the next business day following your procedure to check on you and address any questions or concerns that you may have at that time regarding the information given to you following  your procedure. This is a courtesy call and so if there is no answer at the home number and we have not heard from you through the emergency physician on call, we will assume that you have returned to your regular daily activities without incident.  SIGNATURES/CONFIDENTIALITY: You and/or your care partner have signed paperwork which will be entered into your electronic medical record.  These signatures attest to the fact that that the information above on your After Visit Summary has been reviewed and is understood.  Full responsibility of the  confidentiality of this discharge information lies with you and/or your care-partner.  Handouts for hemorrhoids and polyps printed in pt.'s language and given to pt. And caregiver.

## 2013-05-14 NOTE — Progress Notes (Signed)
Called to room to assist during endoscopic procedure.  Patient ID and intended procedure confirmed with present staff. Received instructions for my participation in the procedure from the performing physician.  

## 2013-05-14 NOTE — Op Note (Signed)
[place]  [address]. [place], [id number]   COLONOSCOPY PROCEDURE REPORT  PATIENT: Theresa Duran, Theresa Duran  MR#: [id number] BIRTHDATE: November 17, 1945 , 67  yrs. old GENDER: Female ENDOSCOPIST: Gatha Mayer, MD, [facility] REFERRED BY:   Vance Gather, MD PROCEDURE DATE:  05/14/2013 PROCEDURE:   Colonoscopy with biopsy and snare polypectomy First Screening Colonoscopy - Avg.  risk and is 50 yrs.  old or older Yes.  Prior Negative Screening - Now for repeat screening. N/A  History of Adenoma - Now for follow-up colonoscopy & has been > or = to 3 yrs.  N/A  Polyps Removed Today? Yes. ASA CLASS:   Class II INDICATIONS:average risk screening and first colonoscopy. MEDICATIONS: propofol (Diprivan) 300mg  IV, MAC sedation, administered by CRNA, and These medications were titrated to patient response per physician's verbal order  DESCRIPTION OF PROCEDURE:   After the risks benefits and alternatives of the procedure were thoroughly explained, informed consent was obtained.  A digital rectal exam revealed several skin tags.   The LB [id number]  endoscope was introduced through the anus and advanced to the cecum, which was identified by both the appendix and ileocecal valve. No adverse events experienced. The quality of the prep was excellent using Suprep  The instrument was then slowly withdrawn as the colon was fully examined.  COLON FINDINGS: Two diminutive sessile polyps were found in the ascending colon.  A polypectomy was performed with a cold snare. The resection was complete and the polyp tissue was completely retrieved.   A diminutive sessile polyp was found in the sigmoid colon.  A polypectomy was performed with cold forceps.  The resection was complete and the polyp tissue was completely retrieved.   Small external hemorrhoids were found.   The colon mucosa was otherwise normal.  Retroflexion was not performed due to a narrow rectal vault. The time to cecum=1 minutes 29  seconds. Withdrawal time=11 minutes 53 seconds.  The scope was withdrawn and the procedure completed. COMPLICATIONS: There were no complications.  ENDOSCOPIC IMPRESSION: 1.   Two diminutive sessile polyps were found in the ascending colon; polypectomy was performed with a cold snare 2.   Diminutive sessile polyp was found in the sigmoid colon; polypectomy was performed with cold forceps 3.   Small external hemorrhoids 4.   The colon mucosa was otherwise normal - excellent prep - first screening  RECOMMENDATIONS: Timing of repeat colonoscopy will be determined by pathology findings.   eSigned:  Gatha Mayer, MD, [facility] 05/14/2013 10:20 AM  cc: The Patient  and Vance Gather, MD

## 2013-05-15 ENCOUNTER — Telehealth: Payer: Self-pay | Admitting: *Deleted

## 2013-05-15 NOTE — Telephone Encounter (Signed)
  Follow up Call-  Call back number 05/14/2013  Post procedure Call Back phone  # [phone]  Permission to leave phone message Yes     Patient questions:  Do you have a fever, pain , or abdominal swelling? no Pain Score  0 *  Have you tolerated food without any problems? yes  Have you been able to return to your normal activities? yes  Do you have any questions about your discharge instructions: Diet   no Medications  no Follow up visit  no  Do you have questions or concerns about your Care? no  Actions: * If pain score is 4 or above: No action needed, pain <4.

## 2013-05-17 ENCOUNTER — Encounter: Payer: Self-pay | Admitting: Internal Medicine

## 2013-05-17 NOTE — Progress Notes (Signed)
Quick Note:  Diminutive adenoma and 2 polyps not pre-cancerous Repeat colonoscopy approx 2020 ______

## 2013-10-29 ENCOUNTER — Telehealth: Payer: Self-pay | Admitting: Family Medicine

## 2013-10-29 DIAGNOSIS — M545 Low back pain: Secondary | ICD-10-CM

## 2013-10-29 NOTE — Telephone Encounter (Signed)
Daughter called and her mother is at another doctors office and he wants her to have a referral to a chiropractor. She would like her PCP to do the referral and if you have any questions please call her/ jw

## 2013-10-29 NOTE — Telephone Encounter (Signed)
I agree with referral and have ordered it.

## 2013-11-26 ENCOUNTER — Telehealth: Payer: Self-pay | Admitting: Family Medicine

## 2013-11-26 NOTE — Telephone Encounter (Signed)
Spoke with daughter regarding mother's Medicaid card.  Daughter said she tried to get case worker to change and was told that there was no need.  Contacted Cherene Altes with DSS, my contact for Medicaid and she changed to [facility].  Card will come around Sept 1st.  Left this message to daughter.

## 2013-11-27 NOTE — Telephone Encounter (Signed)
Referral placed with [facility].  Waiting for authorization from [employer] for this appt. Ashlee Player,CMA

## 2014-01-31 ENCOUNTER — Ambulatory Visit (INDEPENDENT_AMBULATORY_CARE_PROVIDER_SITE_OTHER): Payer: Medicare HMO | Admitting: Family Medicine

## 2014-01-31 ENCOUNTER — Encounter: Payer: Self-pay | Admitting: Family Medicine

## 2014-01-31 VITALS — BP 134/78 | HR 100 | Temp 98.1°F | Resp 20 | Ht <= 58 in | Wt 150.0 lb

## 2014-01-31 DIAGNOSIS — I10 Essential (primary) hypertension: Secondary | ICD-10-CM

## 2014-01-31 DIAGNOSIS — B354 Tinea corporis: Secondary | ICD-10-CM

## 2014-01-31 DIAGNOSIS — E785 Hyperlipidemia, unspecified: Secondary | ICD-10-CM

## 2014-01-31 DIAGNOSIS — R030 Elevated blood-pressure reading, without diagnosis of hypertension: Secondary | ICD-10-CM

## 2014-01-31 DIAGNOSIS — IMO0001 Reserved for inherently not codable concepts without codable children: Secondary | ICD-10-CM

## 2014-01-31 DIAGNOSIS — R3589 Other polyuria: Secondary | ICD-10-CM

## 2014-01-31 DIAGNOSIS — R358 Other polyuria: Secondary | ICD-10-CM

## 2014-01-31 DIAGNOSIS — N390 Urinary tract infection, site not specified: Secondary | ICD-10-CM

## 2014-01-31 DIAGNOSIS — Z23 Encounter for immunization: Secondary | ICD-10-CM

## 2014-01-31 LAB — BASIC METABOLIC PANEL
BUN: 18 mg/dL (ref 6–23)
CO2: 32 mEq/L (ref 19–32)
CREATININE: 0.6 mg/dL (ref 0.50–1.10)
Calcium: 9.9 mg/dL (ref 8.4–10.5)
Chloride: 98 mEq/L (ref 96–112)
GLUCOSE: 90 mg/dL (ref 70–99)
POTASSIUM: 2.9 meq/L — AB (ref 3.5–5.3)
Sodium: 140 mEq/L (ref 135–145)

## 2014-01-31 LAB — CBC
HCT: 41.1 % (ref 36.0–46.0)
Hemoglobin: 13.9 g/dL (ref 12.0–15.0)
MCH: 31.5 pg (ref 26.0–34.0)
MCHC: 33.8 g/dL (ref 30.0–36.0)
MCV: 93.2 fL (ref 78.0–100.0)
PLATELETS: 300 10*3/uL (ref 150–400)
RBC: 4.41 MIL/uL (ref 3.87–5.11)
RDW: 13.6 % (ref 11.5–15.5)
WBC: 7.3 10*3/uL (ref 4.0–10.5)

## 2014-01-31 LAB — POCT GLYCOSYLATED HEMOGLOBIN (HGB A1C): Hemoglobin A1C: 5.9

## 2014-01-31 LAB — POCT URINALYSIS DIPSTICK
BILIRUBIN UA: NEGATIVE
GLUCOSE UA: NEGATIVE
Ketones, UA: NEGATIVE
Nitrite, UA: NEGATIVE
Protein, UA: NEGATIVE
Spec Grav, UA: 1.02
UROBILINOGEN UA: 0.2
pH, UA: 6.5

## 2014-01-31 LAB — LIPID PANEL
CHOL/HDL RATIO: 2.8 ratio
CHOLESTEROL: 152 mg/dL (ref 0–200)
HDL: 54 mg/dL (ref >39)
LDL Cholesterol: 77 mg/dL (ref 0–99)
TRIGLYCERIDES: 107 mg/dL (ref <150)
VLDL: 21 mg/dL (ref 0–40)

## 2014-01-31 LAB — POCT UA - MICROSCOPIC ONLY

## 2014-01-31 MED ORDER — SIMVASTATIN 40 MG PO TABS: 40.0000 mg | ORAL_TABLET | Freq: Every day | ORAL | Status: DC

## 2014-01-31 MED ORDER — HYDROCHLOROTHIAZIDE 25 MG PO TABS: 25.0000 mg | ORAL_TABLET | Freq: Every day | ORAL | Status: DC

## 2014-02-01 ENCOUNTER — Telehealth: Payer: Self-pay | Admitting: Family Medicine

## 2014-02-01 DIAGNOSIS — N390 Urinary tract infection, site not specified: Secondary | ICD-10-CM | POA: Insufficient documentation

## 2014-02-01 MED ORDER — CEPHALEXIN 500 MG PO CAPS: 500.0000 mg | ORAL_CAPSULE | Freq: Two times a day (BID) | ORAL | Status: DC

## 2014-02-01 NOTE — Telephone Encounter (Signed)
Spoke with patient's daughter and explained to her below

## 2014-02-01 NOTE — Telephone Encounter (Signed)
Likely cause of urinary frequency is UTI as indicated by U/A. Rx keflex x7 days.

## 2014-02-06 NOTE — Assessment & Plan Note (Signed)
Symptoms + characteristic UA findings. Rx abx.

## 2014-02-06 NOTE — Progress Notes (Signed)
Patient ID: [name], female   DOB: 1946/01/23, 68 y.o.   MRN: [id number]   Subjective:  Theresa Duran is a 68 y.o. female with a history of HTN, HLD, tricompartmental left knee OA here for follow up and medication refill.  She reports polyuria, especially nocturia for the past week. Denies dysuria. No vaginal itching, burning, discharge, bleeding. No constipation or other change in bowel habits. No abdominal pain or fever.   Does not check BPs at home. Denies HA, CP, SOB, vision changes, palpitations, LE swelling.  Review of Systems:  Per HPI. All other systems reviewed and are negative.    Past Medical History: Patient Active Problem List   Diagnosis Date Noted  . UTI (urinary tract infection) 02/01/2014  . Tinea corporis 01/31/2014  . Personal history of colonic polyps-adenoma 05/14/2013  . Elevated BP 01/14/2012  . Left knee pain 12/28/2011  . 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 Hyperlipidemia 08/18/2011  . Cataract 08/18/2011  . Low back pain 08/18/2011  . Muscle cramps at night 08/18/2011    Medications: reviewed and updated Current Outpatient Prescriptions  Medication Sig Dispense Refill  . acetaminophen (TYLENOL) 500 MG tablet Take 1 tablet (500 mg total) by mouth every 6 (six) hours as needed for pain.  90 tablet  0  . cephALEXin (KEFLEX) 500 MG capsule Take 1 capsule (500 mg total) by mouth 2 (two) times daily.  14 capsule  0  . cholecalciferol (VITAMIN D) 400 UNITS TABS Take 400 Units by mouth.      Marland Kitchen glucosamine-chondroitin 500-400 MG tablet Take 1 tablet by mouth 3 (three) times daily.      . hydrochlorothiazide (HYDRODIURIL) 25 MG tablet Take 1 tablet (25 mg total) by mouth daily.  90 tablet  3  . Multiple Vitamins-Minerals (CENTRUM SILVER PO) Take by mouth.      Marland Kitchen omeprazole (PRILOSEC) 20 MG capsule Take 1 capsule (20 mg total) by mouth daily.  30 capsule  3  . simvastatin (ZOCOR) 40 MG tablet Take 1 tablet (40 mg total) by mouth at bedtime.  90 tablet  3  . traMADol  (ULTRAM) 50 MG tablet Take 1 tablet (50 mg total) by mouth every 8 (eight) hours as needed for pain.  30 tablet  0   No current facility-administered medications for this visit.    Objective:  BP 134/78  Pulse 100  Temp(Src) 98.1 F (36.7 C) (Oral)  Resp 20  Ht 4' 9.48" (1.46 m)  Wt 150 lb (68.04 kg)  BMI 31.92 kg/m2  SpO2 96%  Gen:  68 y.o. female in NAD HEENT: MMM, EOMI, PERRL, anicteric sclerae CV: RRR, no murmur, no JVD Resp: Non-labored, CTAB, no wheezes noted Abd: Soft, NTND, BS present, no guarding or organomegaly MSK: No edema noted, full ROM Neuro: Alert and oriented, speech normal    Lab Results  Component Value Date   WBC 7.3 01/31/2014   HGB 13.9 01/31/2014   HCT 41.1 01/31/2014   MCV 93.2 01/31/2014   PLT 300 01/31/2014   Lab Results  Component Value Date   TSH 2.020 08/18/2011   Lab Results  Component Value Date   HGBA1C 5.9 01/31/2014   Assessment:     Theresa Duran is a 68 y.o. female here for follow up.     Plan:   Problem List Items Addressed This Visit     Musculoskeletal and Integument   Tinea corporis     Other   Hyperlipidemia   Relevant Medications  simvastatin (ZOCOR) tablet      hydrochlorothiazide tablet   Other Relevant Orders      Lipid Panel (Completed)   Elevated BP - Primary   Relevant Orders      Basic Metabolic Panel (Completed)      CBC (Completed)    Other Visit Diagnoses   Essential hypertension        Relevant Medications       simvastatin (ZOCOR) tablet       hydrochlorothiazide tablet    Other Relevant Orders       HgB A1c (Completed)       CBC (Completed)    Polyuria        Relevant Orders       HgB A1c (Completed)       Basic Metabolic Panel (Completed)       Urinalysis Dipstick (Completed)       POCT UA - Microscopic Only (Completed)    Encounter for immunization

## 2014-03-11 ENCOUNTER — Telehealth: Payer: Self-pay | Admitting: Family Medicine

## 2014-03-11 NOTE — Telephone Encounter (Signed)
Pts daughter is checking status of an rx that was supposed to be called in the last time pt was here. Not sure of the name but says it was a cream for pts legs.

## 2014-03-21 MED ORDER — CLOTRIMAZOLE 1 % EX CREA: 1.0000 "application " | TOPICAL_CREAM | Freq: Two times a day (BID) | CUTANEOUS | Status: DC

## 2014-03-21 NOTE — Telephone Encounter (Signed)
Cream for tinea infection of legs has been sent in. I apologize for that oversight.

## 2015-01-21 ENCOUNTER — Other Ambulatory Visit: Payer: Self-pay | Admitting: Family Medicine

## 2015-02-03 ENCOUNTER — Ambulatory Visit (INDEPENDENT_AMBULATORY_CARE_PROVIDER_SITE_OTHER): Payer: Commercial Managed Care - HMO | Admitting: Family Medicine

## 2015-02-03 ENCOUNTER — Encounter: Payer: Self-pay | Admitting: Family Medicine

## 2015-02-03 VITALS — BP 152/76 | HR 74 | Temp 97.6°F | Ht <= 58 in | Wt 150.0 lb

## 2015-02-03 DIAGNOSIS — R8271 Bacteriuria: Secondary | ICD-10-CM | POA: Insufficient documentation

## 2015-02-03 DIAGNOSIS — R350 Frequency of micturition: Secondary | ICD-10-CM

## 2015-02-03 DIAGNOSIS — M545 Low back pain: Secondary | ICD-10-CM | POA: Diagnosis not present

## 2015-02-03 DIAGNOSIS — I10 Essential (primary) hypertension: Secondary | ICD-10-CM | POA: Diagnosis not present

## 2015-02-03 DIAGNOSIS — M810 Age-related osteoporosis without current pathological fracture: Secondary | ICD-10-CM

## 2015-02-03 DIAGNOSIS — Z1239 Encounter for other screening for malignant neoplasm of breast: Secondary | ICD-10-CM

## 2015-02-03 DIAGNOSIS — E785 Hyperlipidemia, unspecified: Secondary | ICD-10-CM | POA: Diagnosis not present

## 2015-02-03 DIAGNOSIS — Z23 Encounter for immunization: Secondary | ICD-10-CM | POA: Diagnosis not present

## 2015-02-03 DIAGNOSIS — R351 Nocturia: Secondary | ICD-10-CM | POA: Insufficient documentation

## 2015-02-03 LAB — CBC
HCT: 39.6 % (ref 36.0–46.0)
Hemoglobin: 13.5 g/dL (ref 12.0–15.0)
MCH: 31.8 pg (ref 26.0–34.0)
MCHC: 34.1 g/dL (ref 30.0–36.0)
MCV: 93.2 fL (ref 78.0–100.0)
MPV: 10.2 fL (ref 8.6–12.4)
Platelets: 262 10*3/uL (ref 150–400)
RBC: 4.25 MIL/uL (ref 3.87–5.11)
RDW: 13.5 % (ref 11.5–15.5)
WBC: 5.8 10*3/uL (ref 4.0–10.5)

## 2015-02-03 LAB — POCT URINALYSIS DIPSTICK
BILIRUBIN UA: NEGATIVE
Glucose, UA: NEGATIVE
KETONES UA: NEGATIVE
NITRITE UA: NEGATIVE
PH UA: 7.5
PROTEIN UA: NEGATIVE
Spec Grav, UA: 1.02
Urobilinogen, UA: 0.2

## 2015-02-03 LAB — BASIC METABOLIC PANEL WITH GFR
BUN: 19 mg/dL (ref 7–25)
CALCIUM: 9.6 mg/dL (ref 8.6–10.4)
CO2: 30 mmol/L (ref 20–31)
CREATININE: 0.64 mg/dL (ref 0.50–0.99)
Chloride: 100 mmol/L (ref 98–110)
GFR, Est African American: >89 mL/min (ref >=60)
GFR, Est Non African American: >89 mL/min (ref >=60)
Glucose, Bld: 95 mg/dL (ref 65–99)
Potassium: 3.5 mmol/L (ref 3.5–5.3)
SODIUM: 143 mmol/L (ref 135–146)

## 2015-02-03 LAB — LIPID PANEL
Cholesterol: 156 mg/dL (ref 125–200)
HDL: 54 mg/dL (ref >=46)
LDL Cholesterol: 77 mg/dL (ref <130)
Total CHOL/HDL Ratio: 2.9 Ratio (ref <=5.0)
Triglycerides: 127 mg/dL (ref <150)
VLDL: 25 mg/dL (ref <30)

## 2015-02-03 LAB — POCT UA - MICROSCOPIC ONLY

## 2015-02-03 MED ORDER — SIMVASTATIN 40 MG PO TABS: 40.0000 mg | ORAL_TABLET | Freq: Every day | ORAL | Status: DC

## 2015-02-03 MED ORDER — TRAMADOL HCL 50 MG PO TABS: 50.0000 mg | ORAL_TABLET | Freq: Four times a day (QID) | ORAL | Status: DC | PRN

## 2015-02-03 MED ORDER — AMLODIPINE BESYLATE 5 MG PO TABS: 5.0000 mg | ORAL_TABLET | Freq: Every day | ORAL | Status: DC

## 2015-02-03 NOTE — Assessment & Plan Note (Signed)
With a history imaging showing DJD. Try tramadol prn. Continue stretching and exercises. Consider PT if no improvement.

## 2015-02-03 NOTE — Patient Instructions (Addendum)
-   Get your mammogram - Get a DEXA bone density scan to screen for osteoporosis - Take the new blood pressure medicine once a day every day. It's called norvasc 5mg .  - Return in about 4 weeks to discuss your blood pressure  Things to do to keep yourself healthy: - Be active at least 30 minutes a day,  4-5 days a week.  - Eat a diet with lots of fruits and vegetables, up to 7-9 servings per day.  - Seatbelts can save your life. Wear them always. - Smoke detectors on every level of your home, check batteries every year. - Health Care Power of Attorney: Choose someone to speak for you if you are not able. - Depression is common in our stressful world. If you're feeling down or losing interest in things you normally enjoy, please come in for a visit. - Violence: If anyone is threatening or hurting you, please call immediately. You are not alone.

## 2015-02-03 NOTE — Assessment & Plan Note (Signed)
Mildly above goal. With complaints of frequent urination, will try nondiuretic - norvasc 5mg  started.  - Follow up in 1 month for blood pressure.

## 2015-02-03 NOTE — Assessment & Plan Note (Signed)
On dipstick and microscopy, will send to culture and call / treat if positive. Discussed lifestyle modifications to decrease nocturia.

## 2015-02-03 NOTE — Progress Notes (Signed)
Subjective: Theresa Duran is a 69 y.o. 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  No new concerns, just the intermittent sharp back pain worse with long sitting in the lower back R > L, never present when standing/walking, bending.   She reports frequent nocturia (q 3 hours at timers with urge incontinence) without dysuria, fever, abd pain, urge or stress incontinence.   Last menstrual period: decades ago, no recent vaginal bleeding or discharge. Not sexually active. No hot flashes.   Last mammogram: none in records FH of breast, uterine, ovarian, colon cancer: No Breast mass or concerns: No 2001 Total hysterectomy for non oncologic cause (uterine prolapse). No abnormal pap smears.   - Review of Systems: Per HPI. All other systems reviewed and are negative. - Past medical, family and social histories as well as medications were reviewed and updated.  Objective:  BP 152/76 mmHg  Pulse 74  Temp(Src) 97.6 F (36.4 C) (Oral)  Ht 4\' 9"  (1.448 m)  Wt 150 lb (68.04 kg)  BMI 32.45 kg/m2 Gen:  69 y.o. female in NAD HEENT: MMM, EOMI, PERRL, anicteric sclerae, nares patent, oropharynx clear, no thyromegaly CV: RRR, no MRG, no JVD, 2+ radial and DP pulses bilaterally Resp: Non-labored breathing ambient air, CTAB, no wheezes noted Breasts: Appear normal, no suspicious masses, no skin or nipple changes or axillary nodes on palpation. Abd: Soft, NTND, BS present, no guarding or organomegaly Neuro: Alert and oriented, speech normal Pelvic: Deferred per pt preference and history of total hysterectomy for non oncologic cause (uterine prolapse)  Lumbar XR 12/12/2012: Mild osteophytic changes and facet hypertrophic changes are noted. No significant anterolisthesis or retrolisthesis is noted.  Assessment/Plan: Theresa Duran is a 69 y.o. female here for annual gynecologic exam.   Health maintenance:  - Pap  smear: had total hysterectomy 2001 for non oncologic cause (uterine prolapse) - Mammogram: Due, ordered  - Vaccinations: Pneumonia, flu vaccinations given - Lipid screening: Ordered, on statin, refilled - DEXA scan indicated: ordered   Patient requests that all lab results be communicated by phone to her daughter, Theresa Duran at [phone].   Bacteriuria On dipstick and microscopy, will send to culture and call / treat if positive. Discussed lifestyle modifications to decrease nocturia.   Essential hypertension, benign Mildly above goal. With complaints of frequent urination, will try nondiuretic - norvasc 5mg  started.  - Follow up in 1 month for blood pressure.   Low back pain With a history imaging showing DJD. Try tramadol prn. Continue stretching and exercises. Consider PT if no improvement.

## 2015-02-04 ENCOUNTER — Encounter: Payer: Self-pay | Admitting: Family Medicine

## 2015-02-04 ENCOUNTER — Telehealth: Payer: Self-pay | Admitting: Family Medicine

## 2015-02-04 NOTE — Telephone Encounter (Signed)
Pt with dipstick + hematuria without gross hematuria. Will need to return for repeat within the next month for repeat urinalysis. If continues to have hematuria > 3 RBCs / HPF she will need CT scan.

## 2015-02-10 NOTE — Telephone Encounter (Signed)
Used [place] Landry [phone]) with [employer] interpreters to make a call. Call did not go through. Will try again later today. Ottis Stain, CMA

## 2015-02-14 NOTE — Telephone Encounter (Signed)
-----   Message from Patrecia Pour, MD sent at 02/04/2015 3:59 PM EDT -----    Can you please have her schedule a follow up office visit in about 3 - 4 weeks for a repeat urinalysis. She prefers that communications are made by phone to her daughter, Larrie Kass at [phone].

## 2015-02-14 NOTE — Telephone Encounter (Signed)
LM for daugter to call back .Theresa Duran, Theresa Duran

## 2015-02-17 DIAGNOSIS — H4423 Degenerative myopia, bilateral: Secondary | ICD-10-CM | POA: Diagnosis not present

## 2015-02-17 DIAGNOSIS — H35343 Macular cyst, hole, or pseudohole, bilateral: Secondary | ICD-10-CM | POA: Diagnosis not present

## 2015-02-24 NOTE — Telephone Encounter (Signed)
Pt has an appt for Dec 13th. Ottis Stain, CMA

## 2015-03-25 ENCOUNTER — Encounter: Payer: Self-pay | Admitting: Family Medicine

## 2015-03-25 ENCOUNTER — Other Ambulatory Visit: Payer: Self-pay | Admitting: Family Medicine

## 2015-03-25 ENCOUNTER — Ambulatory Visit (INDEPENDENT_AMBULATORY_CARE_PROVIDER_SITE_OTHER): Payer: Commercial Managed Care - HMO | Admitting: Family Medicine

## 2015-03-25 VITALS — BP 140/76 | HR 77 | Temp 97.7°F | Ht <= 58 in | Wt 148.2 lb

## 2015-03-25 DIAGNOSIS — G8929 Other chronic pain: Secondary | ICD-10-CM | POA: Diagnosis not present

## 2015-03-25 DIAGNOSIS — R3915 Urgency of urination: Secondary | ICD-10-CM | POA: Diagnosis not present

## 2015-03-25 DIAGNOSIS — M545 Low back pain, unspecified: Secondary | ICD-10-CM

## 2015-03-25 LAB — POCT URINALYSIS DIPSTICK
Glucose, UA: NEGATIVE
KETONES UA: 15
Nitrite, UA: NEGATIVE
PH UA: 6
PROTEIN UA: 30
SPEC GRAV UA: 1.025
Urobilinogen, UA: 0.2

## 2015-03-25 LAB — POCT UA - MICROSCOPIC ONLY

## 2015-03-25 MED ORDER — MELOXICAM 15 MG PO TABS: 15.0000 mg | ORAL_TABLET | Freq: Every day | ORAL | Status: DC

## 2015-03-25 MED ORDER — OXYBUTYNIN CHLORIDE 5 MG PO TABS: 5.0000 mg | ORAL_TABLET | Freq: Every day | ORAL | Status: DC

## 2015-03-25 NOTE — Patient Instructions (Signed)
Thank you for coming in today!  We are checking some labs today, and we'll call you if they are abnormal.  Pleaser take mobic once a day for your back pain. This should not cause dizziness of vomiting. If you have any issues with it please call the clinic. You will also be contacted by physical therapy to schedule an appointment.   TAKE oxybutynin once a day about 1 hour before you go to sleep and make sure you urinate just before going to bed and don't drink anything after dinner.   I'd like to see you in 1 month to see how this medication is working for you, so please schedule a 1 month follow up for night time urination on your way out.   Our clinic's number is [phone]. Feel free to call any time with questions or concerns. We will answer any questions after hours with our 24-hour emergency line at that number as well.   - Dr. Bonner Puna

## 2015-03-25 NOTE — Progress Notes (Signed)
Subjective: Theresa Duran is a 69 y.o. female presenting for back pain and nocturia.   Urinates 4 - 5 times per night for the past several months. There is occasional leakage of urine when asleep but not with coughing, laughing, sneezing. Reports urgency without incontinence. She has no burning with urination. Does not drink anything after 8pm.   Has midline lower back pain for many months. Constant, moderate, improved with tramadol but she had vomiting with this medication so stopped taking it. Also tried massage with limited benefit. No other medications or PT tried.   - ROS: Pt denies any current bowel/bladder problems, fever, chills, unintentional weight loss, night time awakenings secondary to pain, weakness in one or both legs - Non-smoker  Objective: BP 140/76 mmHg  Pulse 77  Temp(Src) 97.7 F (36.5 C) (Oral)  Ht 4\' 9"  (1.448 m)  Wt 148 lb 3.2 oz (67.223 kg)  BMI 32.06 kg/m2 Gen: Well-appearing 69 yo [place] female in no distress CV: Regular rate, no murmur; radial, DP and PT pulses 2+ bilaterally; no LE edema, no JVD, cap refill < 2 sec. Pulm: Non-labored breathing ambient air; CTAB, no wheezes or crackles GI: Normoactive BS; soft, non-tender, non-distended, no suprapubic discomfort. Back: Normal skin. Spine with normal alignment and no deformity. No tenderness to vertebral process palpation. Paraspinous muscles are not tender and without spasm.   Range of motion is full at neck and lumbar sacral regions. Straight leg raise is neg Neuro:  Sensation and motor function 5/5 bilateral lower extremities. Patellar and achilles DTR's 2+  Assessment/Plan: Theresa Duran is a 69 y.o. female here for nocturia and chronic back pain.  Please contact pt's daughter [phone], Myrla Halsted with lab results.  Urinary urgency Predominantly night time symptoms so will Rx oxybutynin qHS and implement lifestyle modifications. Checking urine culture as this was not sent at previous office visit.    Low back pain Tramadol caused nausea so will revert to NSAIDs, stretches, and refer for physical therapy. No signs of SCC.

## 2015-03-25 NOTE — Assessment & Plan Note (Addendum)
Predominantly night time symptoms so will Rx oxybutynin qHS and implement lifestyle modifications. Checking urine culture as this was not sent at previous office visit.

## 2015-03-25 NOTE — Assessment & Plan Note (Signed)
Tramadol caused nausea so will revert to NSAIDs, stretches, and refer for physical therapy. No signs of SCC.

## 2015-03-27 LAB — URINE CULTURE
COLONY COUNT: NO GROWTH
ORGANISM ID, BACTERIA: NO GROWTH

## 2015-04-17 ENCOUNTER — Ambulatory Visit
Admission: RE | Admit: 2015-04-17 | Discharge: 2015-04-17 | Disposition: A | Discharge status: Home / Self Care | Payer: Commercial Managed Care - HMO | Source: Ambulatory Visit | Attending: Family Medicine | Admitting: Family Medicine

## 2015-04-17 DIAGNOSIS — Z1231 Encounter for screening mammogram for malignant neoplasm of breast: Secondary | ICD-10-CM | POA: Diagnosis not present

## 2015-04-17 DIAGNOSIS — Z1239 Encounter for other screening for malignant neoplasm of breast: Secondary | ICD-10-CM

## 2015-04-24 ENCOUNTER — Ambulatory Visit (INDEPENDENT_AMBULATORY_CARE_PROVIDER_SITE_OTHER): Payer: Commercial Managed Care - HMO | Admitting: Family Medicine

## 2015-04-24 ENCOUNTER — Encounter: Payer: Self-pay | Admitting: Family Medicine

## 2015-04-24 VITALS — BP 134/66 | HR 86 | Temp 97.5°F | Ht <= 58 in | Wt 149.0 lb

## 2015-04-24 DIAGNOSIS — R350 Frequency of micturition: Secondary | ICD-10-CM | POA: Diagnosis not present

## 2015-04-24 DIAGNOSIS — R3915 Urgency of urination: Secondary | ICD-10-CM

## 2015-04-24 DIAGNOSIS — R351 Nocturia: Secondary | ICD-10-CM | POA: Diagnosis not present

## 2015-04-24 LAB — POCT URINALYSIS DIPSTICK
Bilirubin, UA: NEGATIVE
Glucose, UA: NEGATIVE
Nitrite, UA: NEGATIVE
PH UA: 6
PROTEIN UA: NEGATIVE
SPEC GRAV UA: 1.015
UROBILINOGEN UA: 0.2

## 2015-04-24 LAB — POCT GLYCOSYLATED HEMOGLOBIN (HGB A1C): Hemoglobin A1C: 5.7

## 2015-04-24 LAB — POCT UA - MICROSCOPIC ONLY

## 2015-04-24 MED ORDER — MIRABEGRON ER 25 MG PO TB24: 25.0000 mg | ORAL_TABLET | Freq: Every day | ORAL | Status: DC

## 2015-04-24 NOTE — Assessment & Plan Note (Signed)
Did not tolerate just 5mg  of oxybutynin. Will try myrbetriq. Hb A1c r/o DM as cause of polyuria. Repeat urine Cx.

## 2015-04-24 NOTE — Patient Instructions (Signed)
Thank you for coming in today!  We are checking some labs today, and we'll call you if they are abnormal.  START taking myrbetriq once a day with food. This is the best medicine for your symptoms. Follow up in 1 month to see how much it is helping you.   Our clinic's number is [phone]. Feel free to call any time with questions or concerns. We will answer any questions after hours with our 24-hour emergency line at that number as well.   - Dr. Bonner Puna

## 2015-04-24 NOTE — Progress Notes (Signed)
Subjective: Theresa Duran is a 70 y.o. female presenting for back pain and nocturia.   Use of [place] video interpretor, [place], was used throughout encounter.   Urinates 4 - 5 times per night for the past several months. Daytime urination every 2 - 3 hours because she drinks plenty of water. There is occasional leakage of urine when asleep but not with coughing, laughing, sneezing. Reports urgency without incontinence. She has no burning with urination and urine culture on 03/25/2015 was negative. Does not drink anything after 8pm, urinates prior to bedtime. She noticed stomach upset within 2 days of starting oxybutynin qHS 1 month ago and so stopped taking it and would like a different medication. She is taking omeprazole.   Had Bi-RADS 1 result of mammogram 04/17/2015.  - ROS: Pt denies fever, chills, flank pain.  - Non-smoker  Objective: BP 134/66 mmHg  Pulse 86  Temp(Src) 97.5 F (36.4 C) (Oral)  Ht 4\' 9"  (1.448 m)  Wt 149 lb (67.586 kg)  BMI 32.23 kg/m2 Gen: Well-appearing 70 yo [place] female in no distress CV: Regular rate, no murmur; radial, DP and PT pulses 2+ bilaterally; no LE edema, no JVD, cap refill < 2 sec. Pulm: Non-labored breathing ambient air; CTAB, no wheezes or crackles GI: Normoactive BS; soft, non-tender, non-distended, no suprapubic discomfort. Neuro:  Sensation and motor function 5/5 bilateral lower extremities. Patellar and achilles DTR's 2+  Assessment/Plan: Theresa Duran is a 70 y.o. female here for nocturia and chronic back pain.  Urinary urgency Did not tolerate just 5mg  of oxybutynin. Will try myrbetriq. Hb A1c r/o DM as cause of polyuria. Repeat urine Cx.  Please contact pt's daughter, Myrla Halsted, at [phone] with any lab results.

## 2015-04-29 ENCOUNTER — Other Ambulatory Visit: Payer: Self-pay | Admitting: Family Medicine

## 2015-05-01 ENCOUNTER — Telehealth: Payer: Self-pay

## 2015-05-01 NOTE — Telephone Encounter (Signed)
04/30/15 per daughter patient does not need PT, back pain due to way she is sleeping

## 2015-05-05 ENCOUNTER — Ambulatory Visit: Payer: Commercial Managed Care - HMO | Admitting: Rehabilitation

## 2015-05-22 ENCOUNTER — Ambulatory Visit (INDEPENDENT_AMBULATORY_CARE_PROVIDER_SITE_OTHER): Payer: Commercial Managed Care - HMO | Admitting: Family Medicine

## 2015-05-22 ENCOUNTER — Encounter: Payer: Self-pay | Admitting: Family Medicine

## 2015-05-22 VITALS — BP 128/51 | HR 79 | Temp 97.6°F | Ht <= 58 in | Wt 150.7 lb

## 2015-05-22 DIAGNOSIS — IMO0002 Reserved for concepts with insufficient information to code with codable children: Secondary | ICD-10-CM

## 2015-05-22 DIAGNOSIS — N8110 Cystocele, unspecified: Secondary | ICD-10-CM

## 2015-05-22 MED ORDER — CETIRIZINE HCL 10 MG PO TABS: 10.0000 mg | ORAL_TABLET | Freq: Every day | ORAL | Status: DC

## 2015-05-22 NOTE — Progress Notes (Signed)
Subjective: Synquis Gilardi is a 70 y.o. female returning for nocturia.  [employer] video [place] interpretor, [id number] was used throughout encounter.   She has had gradually worsening urinary urgency worse at night, but present all day for the past 6 months. She denies urinary incontinence. She does not endorse incomplete emptying or increased frequency. She enacted lifestyle modifications discussed at our last visit and has failed oxybutynin. She took myrbetriq for the past month and derived zero benefit from this. She denies dysuria, hematuria, fevers, flank tenderness, abdominal pain. She has   - ROS: As above - Non-smoker - Medications reviewed and significant for thiazide diuretic which she takes in the morning.  - Had 2 vaginal births. Total hysterectomy, BSO, and anterior colporrhaphy in 2001 for uterine prolapse with cystocele.  Objective: BP 128/51 mmHg  Pulse 79  Temp(Src) 97.6 F (36.4 C) (Oral)  Ht 4\' 9"  (1.448 m)  Wt 150 lb 11.2 oz (68.357 kg)  BMI 32.60 kg/m2 Gen: Well-appearing 70 y.o. female in no distress GI: No CVA or suprapubic tenderness Pelvic: Exam in dorsal lithotomy position. Normal external female genitalia without lesions. Atrophic vaginal mucosa, shallow vagina without lesions or discharge. cystocele extending nearly to vaginal introitus with valsalva. No incontinence during exam. Bimanual exam reveals no palpable masses or tenderness.   April Zimmerman-Rumple, CMA present throughout duration of exam.   Assessment/Plan: Talese Dejulio is a 70 y.o. female here for urinary urgency and cystocele.  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 Will refer to gyn for evaluation. Hysterectomy and anterior colporrhaphy performed by Dr. Freda Munro.

## 2015-05-22 NOTE — Assessment & Plan Note (Signed)
Nearly grade II based on exam today. Recurrent s/p surgical correction in 2001, now causing significant urinary symptoms. Will refer to gyn for evaluation. Hysterectomy and anterior colporrhaphy performed by Dr. Freda Munro.

## 2015-05-22 NOTE — Patient Instructions (Signed)
About Cystocele  Overview  The pelvic organs, including the bladder, are normally supported by pelvic floor muscles and ligaments.  When these muscles and ligaments are stretched, weakened or torn, the wall between the bladder and the vagina sags or herniates causing a prolapse, sometimes called a cystocele.  This condition may cause discomfort and problems with emptying the bladder.  It can be present in various stages.  Some people are not aware of the changes.  Others may notice changes at the vaginal opening or a feeling of the bladder dropping outside the body.  Causes of a Cystocele  A cystocele is usually caused by muscle straining or stretching during childbirth.  In addition, cystocele is more common after menopause, because the hormone estrogen helps keep the elastic tissues around the pelvic organs strong.  A cystocele is more likely to occur when levels of estrogen decrease.  Other causes include: heavy lifting, chronic coughing, previous pelvic surgery and obesity.  Symptoms  A bladder that has dropped from its normal position may cause: unwanted urine leakage (stress incontinence), frequent urination or urge to urinate, incomplete emptying of the bladder (not feeling bladder relief after emptying), pain or discomfort in the vagina, pelvis, groin, lower back or lower abdomen and frequent urinary tract infections.  Mild cases may not cause any symptoms.  Treatment Options  Pelvic floor (Kegel) exercises:  Strength training the muscles in your genital area  Behavioral changes: Treating and preventing constipation, taking time to empty your bladder properly, learning to lift properly and/or avoid heavy lifting when possible, stopping smoking, avoiding weight gain and treating a chronic cough or bronchitis.  A pessary: A vaginal support device is sometimes used to help pelvic support caused by muscle and ligament changes.  Surgery: Surgical repair may be necessary if symptoms cannot  be managed with exercise, behavioral changes and a pessary.  Surgery is usually considered for severe cases.  Kegel Exercises The goal of Kegel exercises is to isolate and exercise your pelvic floor muscles. These muscles act as a hammock that supports the rectum, vagina, small intestine, and uterus. As the muscles weaken, the hammock sags and these organs are displaced from their normal positions. Kegel exercises can strengthen your pelvic floor muscles and help you to improve bladder and bowel control, improve sexual response, and help reduce many problems and some discomfort during pregnancy. Kegel exercises can be done anywhere and at any time. HOW TO PERFORM KEGEL EXERCISES 5. Locate your pelvic floor muscles. To do this, squeeze (contract) the muscles that you use when you try to stop the flow of urine. You will feel a tightness in the vaginal area (women) and a tight lift in the rectal area (men and women). 6. When you begin, contract your pelvic muscles tight for 2-5 seconds, then relax them for 2-5 seconds. This is one set. Do 4-5 sets with a short pause in between. 7. Contract your pelvic muscles for 8-10 seconds, then relax them for 8-10 seconds. Do 4-5 sets. If you cannot contract your pelvic muscles for 8-10 seconds, try 5-7 seconds and work your way up to 8-10 seconds. Your goal is 4-5 sets of 10 contractions each day. Keep your stomach, buttocks, and legs relaxed during the exercises. Perform sets of both short and long contractions. Vary your positions. Perform these contractions 3-4 times per day. Perform sets while you are:   Lying in bed in the morning.  Standing at lunch.  Sitting in the late afternoon.  Lying in bed  at night. You should do 40-50 contractions per day. Do not perform more Kegel exercises per day than recommended. Overexercising can cause muscle fatigue. Continue these exercises for for at least 15-20 weeks or as directed by your caregiver.   This information  is not intended to replace advice given to you by your health care provider. Make sure you discuss any questions you have with your health care provider.   Document Released: 03/15/2012 Document Revised: 04/19/2014 Document Reviewed: 03/15/2012 Elsevier Interactive Patient Education [employer].

## 2015-05-23 DIAGNOSIS — N8110 Cystocele, unspecified: Secondary | ICD-10-CM | POA: Diagnosis not present

## 2015-05-27 ENCOUNTER — Other Ambulatory Visit: Payer: Self-pay | Admitting: Family Medicine

## 2015-05-27 DIAGNOSIS — IMO0002 Reserved for concepts with insufficient information to code with codable children: Secondary | ICD-10-CM

## 2015-05-28 ENCOUNTER — Other Ambulatory Visit: Payer: Self-pay | Admitting: Family Medicine

## 2015-06-11 DIAGNOSIS — R350 Frequency of micturition: Secondary | ICD-10-CM | POA: Diagnosis not present

## 2015-06-11 DIAGNOSIS — N816 Rectocele: Secondary | ICD-10-CM | POA: Diagnosis not present

## 2015-07-27 ENCOUNTER — Other Ambulatory Visit: Payer: Self-pay | Admitting: Family Medicine

## 2015-08-01 ENCOUNTER — Telehealth: Payer: Self-pay | Admitting: *Deleted

## 2015-08-01 DIAGNOSIS — IMO0002 Reserved for concepts with insufficient information to code with codable children: Secondary | ICD-10-CM

## 2015-08-01 NOTE — Telephone Encounter (Signed)
Pts daughter calls questioning if her mother is still supposed to be taking HCTZ and amlodipine.  She does not think that her mother has been taking these because she has 3 bottles that still have meds in them.  She would like a call back for verification. Chantae Soo, Salome Spotted

## 2015-08-01 NOTE — Telephone Encounter (Signed)
These have been prescribed and I thought she was taking them. But, if she has not been taking these medications for the past several months, then she should not restart them as her BP has been within normal range. We can follow up soon to discuss this further.

## 2015-08-04 NOTE — Telephone Encounter (Signed)
Dr. Bonner Puna is better person to order the referral.  I believe patient is referring to Gyn referral to Dr. Freda Munro as documented in Dr. Bonner Puna' note of 05/22/15

## 2015-08-04 NOTE — Telephone Encounter (Signed)
(  Re)referral placed to Urogynecology. Thanks.

## 2015-08-04 NOTE — Telephone Encounter (Signed)
Called Dr. Sherlynn Stalls office. They referred her to Dr. Tresa Moore. Pt had an appointment this am with Dr. Tresa Moore. Called Daughter back. Pt did go to the appt and was told she needed a referral from [place]. Will talk to [place]. Ottis Stain, CMA

## 2015-08-04 NOTE — Telephone Encounter (Signed)
Spoke to daughter. Made pt an appointment for May 4th for med review. Daughter asked if we had received a request for a referral. Daughter said she received a call today from a Dr. Gabriel Carina telling her an appointment will be made once a referral is received from Dr. Andria Frames. The daughter thought it was from a urology office. Please advise. Ottis Stain, CMA

## 2015-08-14 ENCOUNTER — Ambulatory Visit (INDEPENDENT_AMBULATORY_CARE_PROVIDER_SITE_OTHER): Payer: Commercial Managed Care - HMO | Admitting: Family Medicine

## 2015-08-14 ENCOUNTER — Encounter: Payer: Self-pay | Admitting: Family Medicine

## 2015-08-14 VITALS — BP 133/65 | HR 74 | Temp 97.5°F | Ht <= 58 in | Wt 147.6 lb

## 2015-08-14 DIAGNOSIS — M810 Age-related osteoporosis without current pathological fracture: Secondary | ICD-10-CM | POA: Insufficient documentation

## 2015-08-14 DIAGNOSIS — E2839 Other primary ovarian failure: Secondary | ICD-10-CM

## 2015-08-14 DIAGNOSIS — Z9189 Other specified personal risk factors, not elsewhere classified: Secondary | ICD-10-CM | POA: Diagnosis not present

## 2015-08-14 DIAGNOSIS — I10 Essential (primary) hypertension: Secondary | ICD-10-CM | POA: Diagnosis not present

## 2015-08-14 NOTE — Addendum Note (Signed)
Addended by: Vance Gather B on: 08/14/2015 02:04 PM   Modules accepted: Miquel Dunn

## 2015-08-14 NOTE — Assessment & Plan Note (Signed)
She is currently only taking amlodipine (I believe - unfortunately she can't recall whether it's this or HCTZ - she will bring medications to next office visit).  - Continue medication and bring all medications to next office visit for med rec.  - Last BMP wnl. Would draw Hep C with next blood draw.

## 2015-08-14 NOTE — Addendum Note (Signed)
Addended by: Katharina Caper, Khyler Eschmann D on: 08/14/2015 02:25 PM   Modules accepted: Orders

## 2015-08-14 NOTE — Progress Notes (Signed)
Subjective: Theresa Duran is a 70 y.o. female here for hypertension follow up.   She reports taking only one BP medication, though HCTZ and amlodipine are both prescribed for her. We discussed discontinuing the diuretic given her urinary urgency and nocturia, but decided to continue this at our last office visit. Unfortunately she misunderstood and stopped one of the medications, though she doesn't know which. She does not check BP at home, but takes medication as directed and follows a low salt diet. She can walk indefinitely without dyspnea. She has no other concerns at this time.   - ROS: Denies CP, SOB, palpitations, syncope, dizziness, orthopnea, PND, frequent headaches, vision changes, claudication, leg swelling. - PMFSH: Non-smoker, no EtOH, no illicit drugs. - Medications: reviewed and updated  Objective: BP 133/65 mmHg  Pulse 74  Temp(Src) 97.5 F (36.4 C) (Oral)  Ht 4\' 9"  (1.448 m)  Wt 147 lb 9.6 oz (66.951 kg)  BMI 31.93 kg/m2 Gen: Well-appearing 70 y.o.female in no distress Neck: brisk carotid upstroke, no bruits; thyroid not enlarged  Pulm: Non-labored breathing room air; CTAB CV: Regular rate with normal S1/S2, no murmur; no LE edema, no JVD; DP and radial pulses symmetric and 2+. Homan's sign absent. cap refill < 2 sec. GI: Nontender, nondistended, no HSM   Assessment & Plan: Theresa Duran is a 70 y.o. female here for hypertension, currently well controlled on single agent.   See problem list for plan.

## 2015-08-14 NOTE — Assessment & Plan Note (Signed)
Counseled pt in indication for osteoporosis screening, namely her age and sex. She agrees to get DEXA performed.

## 2015-08-14 NOTE — Patient Instructions (Addendum)
You need to have an xray of your hip and back to check for osteoporosis (thin bones). You are at risk for this just because you are a female over the age of 40. I will call you with these results.   Your blood pressure looks good so keep taking the medications you are currently taking. You should bring all your medications with you at your next office visit in about 6 months. You can always call us at [phone] if you need anything.    DASH Eating Plan DASH stands for "Dietary Approaches to Stop Hypertension." The DASH eating plan is a healthy eating plan that has been shown to reduce high blood pressure (hypertension). Additional health benefits may include reducing the risk of type 2 diabetes mellitus, heart disease, and stroke. The DASH eating plan may also help with weight loss. WHAT DO I NEED TO KNOW ABOUT THE DASH EATING PLAN? For the DASH eating plan, you will follow these general guidelines:  Choose foods with a percent daily value for sodium of less than 5% (as listed on the food label).  Use salt-free seasonings or herbs instead of table salt or sea salt.  Check with your health care provider or pharmacist before using salt substitutes.  Eat lower-sodium products, often labeled as "lower sodium" or "no salt added."  Eat fresh foods.  Eat more vegetables, fruits, and low-fat dairy products.  Choose whole grains. Look for the word "whole" as the first word in the ingredient list.  Choose fish and skinless chicken or [place] more often than red meat. Limit fish, poultry, and meat to 6 oz (170 g) each day.  Limit sweets, desserts, sugars, and sugary drinks.  Choose heart-healthy fats.  Limit cheese to 1 oz (28 g) per day.  Eat more home-cooked food and less restaurant, buffet, and fast food.  Limit fried foods.  Cook foods using methods other than frying.  Limit canned vegetables. If you do use them, rinse them well to decrease the sodium.  When eating at a restaurant, ask  that your food be prepared with less salt, or no salt if possible. WHAT FOODS CAN I EAT? Seek help from a dietitian for individual calorie needs. Grains Whole grain or whole wheat bread. Brown rice. Whole grain or whole wheat pasta. Quinoa, bulgur, and whole grain cereals. Low-sodium cereals. Corn or whole wheat flour tortillas. Whole grain cornbread. Whole grain crackers. Low-sodium crackers. Vegetables Fresh or frozen vegetables (raw, steamed, roasted, or grilled). Low-sodium or reduced-sodium tomato and vegetable juices. Low-sodium or reduced-sodium tomato sauce and paste. Low-sodium or reduced-sodium canned vegetables.  Fruits All fresh, canned (in natural juice), or frozen fruits. Meat and Other Protein Products Ground beef (85% or leaner), grass-fed beef, or beef trimmed of fat. Skinless chicken or [place]. Ground chicken or [place]. Pork trimmed of fat. All fish and seafood. Eggs. Dried beans, peas, or lentils. Unsalted nuts and seeds. Unsalted canned beans. Dairy Low-fat dairy products, such as skim or 1% milk, 2% or reduced-fat cheeses, low-fat ricotta or cottage cheese, or plain low-fat yogurt. Low-sodium or reduced-sodium cheeses. Fats and Oils Tub margarines without trans fats. Light or reduced-fat mayonnaise and salad dressings (reduced sodium). Avocado. Safflower, olive, or canola oils. Natural peanut or almond butter. Other Unsalted popcorn and pretzels.  WHAT FOODS ARE NOT RECOMMENDED? Grains White bread. White pasta. White rice. Refined cornbread. Bagels and croissants. Crackers that contain trans fat. Vegetables Creamed or fried vegetables. Vegetables in a cheese sauce. Regular canned vegetables. Regular canned tomato  sauce and paste. Regular tomato and vegetable juices. Fruits Dried fruits. Canned fruit in light or heavy syrup. Fruit juice. Meat and Other Protein Products Fatty cuts of meat. Ribs, chicken wings, bacon, sausage, bologna, salami, chitterlings, fatback, hot  dogs, bratwurst, and packaged luncheon meats. Salted nuts and seeds. Canned beans with salt. Dairy Whole or 2% milk, cream, half-and-half, and cream cheese. Whole-fat or sweetened yogurt. Full-fat cheeses or blue cheese. Nondairy creamers and whipped toppings. Processed cheese, cheese spreads, or cheese curds. Condiments Onion and garlic salt, seasoned salt, table salt, and sea salt. Canned and packaged gravies. Worcestershire sauce. Tartar sauce. Barbecue sauce. Teriyaki sauce. Soy sauce, including reduced sodium. Steak sauce. Fish sauce. Oyster sauce. Cocktail sauce. Horseradish. Ketchup and mustard. Meat flavorings and tenderizers. Bouillon cubes. Hot sauce. Tabasco sauce. Marinades. Taco seasonings. Relishes. Fats and Oils Butter, stick margarine, lard, shortening, ghee, and bacon fat. Coconut, palm kernel, or palm oils. Regular salad dressings. Other Pickles and olives. Salted popcorn and pretzels. The items listed above may not be a complete list of foods and beverages to avoid. Contact your dietitian for more information. WHERE CAN I FIND MORE INFORMATION? National [employer], [employer], and [employer] Institute: travelstabloid.com

## 2015-09-01 ENCOUNTER — Ambulatory Visit
Admission: RE | Admit: 2015-09-01 | Discharge: 2015-09-01 | Disposition: A | Discharge status: Home / Self Care | Payer: Commercial Managed Care - HMO | Source: Ambulatory Visit | Attending: Family Medicine | Admitting: Family Medicine

## 2015-09-01 DIAGNOSIS — E2839 Other primary ovarian failure: Secondary | ICD-10-CM

## 2015-09-01 DIAGNOSIS — M810 Age-related osteoporosis without current pathological fracture: Secondary | ICD-10-CM

## 2015-09-01 DIAGNOSIS — Z780 Asymptomatic menopausal state: Secondary | ICD-10-CM | POA: Diagnosis not present

## 2015-09-02 ENCOUNTER — Telehealth: Payer: Self-pay | Admitting: Family Medicine

## 2015-09-02 NOTE — Telephone Encounter (Signed)
DEXA with evidence of osteoporosis. Will need to have office visit to discuss treatment.

## 2015-09-02 NOTE — Assessment & Plan Note (Signed)
Based on T-score < 2.5, will receive treatment after ensuring normal Ca and vit D.  - Treatment to be discussed at next office visit, but would be a candidate for once weekly alendronate (Cr 0.64) - Start calcium (1200 mg/d) and vitamin D (800 IU daily) - Recheck DEXA in 2 years

## 2015-09-04 NOTE — Telephone Encounter (Addendum)
Called pt using [employer] [place], [place]) The mobile number answered with a fax tone. Called home number and left a message asking pt to call to make an appt with Dr. Bonner Puna to go over test results. Ottis Stain, CMA

## 2015-09-25 DIAGNOSIS — R350 Frequency of micturition: Secondary | ICD-10-CM | POA: Diagnosis not present

## 2015-09-25 DIAGNOSIS — R3121 Asymptomatic microscopic hematuria: Secondary | ICD-10-CM | POA: Diagnosis not present

## 2015-10-30 ENCOUNTER — Other Ambulatory Visit: Payer: Self-pay | Admitting: *Deleted

## 2015-10-30 DIAGNOSIS — I10 Essential (primary) hypertension: Secondary | ICD-10-CM

## 2015-10-30 MED ORDER — AMLODIPINE BESYLATE 5 MG PO TABS: 5.0000 mg | ORAL_TABLET | Freq: Every day | ORAL | Status: DC

## 2015-11-04 DIAGNOSIS — N816 Rectocele: Secondary | ICD-10-CM | POA: Diagnosis not present

## 2015-11-04 DIAGNOSIS — N281 Cyst of kidney, acquired: Secondary | ICD-10-CM | POA: Diagnosis not present

## 2015-11-04 DIAGNOSIS — R350 Frequency of micturition: Secondary | ICD-10-CM | POA: Diagnosis not present

## 2015-11-04 DIAGNOSIS — R351 Nocturia: Secondary | ICD-10-CM | POA: Diagnosis not present

## 2015-11-04 DIAGNOSIS — R3121 Asymptomatic microscopic hematuria: Secondary | ICD-10-CM | POA: Diagnosis not present

## 2015-11-04 DIAGNOSIS — N200 Calculus of kidney: Secondary | ICD-10-CM | POA: Diagnosis not present

## 2015-12-17 ENCOUNTER — Telehealth: Payer: Self-pay | Admitting: Student

## 2016-01-05 ENCOUNTER — Ambulatory Visit: Payer: Self-pay | Admitting: Student

## 2016-01-05 ENCOUNTER — Ambulatory Visit (INDEPENDENT_AMBULATORY_CARE_PROVIDER_SITE_OTHER): Payer: Commercial Managed Care - HMO | Admitting: Student

## 2016-01-05 ENCOUNTER — Encounter: Payer: Self-pay | Admitting: Student

## 2016-01-05 VITALS — BP 153/72 | HR 94 | Temp 97.9°F | Ht <= 58 in | Wt 146.2 lb

## 2016-01-05 DIAGNOSIS — M810 Age-related osteoporosis without current pathological fracture: Secondary | ICD-10-CM | POA: Diagnosis not present

## 2016-01-05 DIAGNOSIS — R079 Chest pain, unspecified: Secondary | ICD-10-CM | POA: Insufficient documentation

## 2016-01-05 DIAGNOSIS — I10 Essential (primary) hypertension: Secondary | ICD-10-CM

## 2016-01-05 DIAGNOSIS — Z23 Encounter for immunization: Secondary | ICD-10-CM | POA: Diagnosis not present

## 2016-01-05 DIAGNOSIS — Z0000 Encounter for general adult medical examination without abnormal findings: Secondary | ICD-10-CM

## 2016-01-05 MED ORDER — AMLODIPINE BESYLATE 10 MG PO TABS: 10.0000 mg | ORAL_TABLET | Freq: Every day | ORAL | 3 refills | Status: DC

## 2016-01-05 NOTE — Patient Instructions (Signed)
It was great seeing you today! We have addressed the following issues today  1. Heaviness in the chest: I don't think this is related to your heart. However, your blood pressure is high today. I have increased the amlodipine to 10 mg daily. Please take 2 of the 5 mg tablets and told you peak up your new prescription for 10 mg. I also recommend follow-up in 2 weeks on your blood pressure.  2.  Please take the prescription for shingles vaccine to the pharmacy to have your shot.     If we did any lab work today, and the results require attention, either me or my nurse will get in touch with you. If everything is normal, you will get a letter in mail. If you don't hear from [place] in two weeks, please give [place] a call. Otherwise, I look forward to talking with you again at our next visit. If you have any questions or concerns before then, please call the clinic at [phone].  Please bring all your medications to every doctors visit   Sign up for My Chart to have easy access to your labs results, and communication with your Primary care physician.    Please check-out at the front desk before leaving the clinic.   Take Care,

## 2016-01-05 NOTE — Assessment & Plan Note (Signed)
We will address this when she returns for follow-up on her hypertension

## 2016-01-05 NOTE — Progress Notes (Signed)
   Subjective:    Patient ID: Theresa Duran is a 70 y.o. old female.  HPI #Heaviness in my chest: this has been going on for three weeks. Heaviness in the right chest. Intermittent cough for "few months". Cough is dry.  Denies shortness of breath. Reports walking daily about a mile and half for exercise. Denies chest discomfort or shortness of breath with exertion. The patient is not able to recall any triggering or alleviating factor. Denies a swelling in her legs. Denies orthopnea or paroxysmal nocturnal dyspnea. Denies fatigue. Denies fever, chills, night time sweating or unintentional weight loss . Overall, there is no progression in her symptoms.   Off note, patient is supposed to be on amlodipine and HCTZ. However, she hasn't been taking HCTZ saying that she is on "good blood pressure medication" (amlodipine) and doesn't think she need a "weak blood pressure medication" (HCTZ).   PMH: reviewed SH: denies smoking.   Review of Systems Per HPI Objective:   Vitals:   01/05/16 1101  BP: (!) 153/72  Pulse: 94  Temp: 97.9 F (36.6 C)  TempSrc: Oral  Weight: 146 lb 3.2 oz (66.3 kg)  Height: 4\' 9"  (1.448 m)    GEN: appears well, no apparent distress. Oropharynx: mmm without erythema or exudation, poor dentition CVS: RRR, normal s1 and s2, no murmurs, no edema, no carotid bruits, dorsalis pedis and posterior tibialis pulses 2+ bilaterally RESP: no increased work of breathing, good air movement bilaterally, no crackles or wheeze GI: soft, non-tender,non-distended HEM: No cervical lymphadenopathy NEURO: alert and oriented appropriately, no gross defecits  PSYCH: appropriate mood and affect     Assessment & Plan:  Chest pain Nonanginal chest pain. Not exertional. Pain also right-sided. Signs and symptoms not suggestive for heart failure. No constitutional symptoms to suggest malignancy. Pain not pleuritic to think of PE. Not reproducible to think of MSK. Recommended taking Tylenol as  needed. If no improvement, we may consider chest x-ray and other workup is necessary.  Essential hypertension, benign Blood pressure 153/72. She has a believe that's hydrochlorothiazide is not a good blood pressure medication. She was only on amlodipine 5 mg daily. Increased her amlodipine to 10 mg daily. Follow-up in 2 weeks.  Osteoporosis We will address this when she returns for follow-up on her hypertension  Routine adult health maintenance Flu and Tdap today. Just paper prescription for Zostavax

## 2016-01-05 NOTE — Assessment & Plan Note (Signed)
Nonanginal chest pain. Not exertional. Pain also right-sided. Signs and symptoms not suggestive for heart failure. No constitutional symptoms to suggest malignancy. Pain not pleuritic to think of PE. Not reproducible to think of MSK. Recommended taking Tylenol as needed. If no improvement, we may consider chest x-ray and other workup is necessary.

## 2016-01-05 NOTE — Assessment & Plan Note (Signed)
Blood pressure 153/72. She has a believe that's hydrochlorothiazide is not a good blood pressure medication. She was only on amlodipine 5 mg daily. Increased her amlodipine to 10 mg daily. Follow-up in 2 weeks.

## 2016-01-05 NOTE — Assessment & Plan Note (Signed)
Flu and Tdap today. Just paper prescription for Zostavax

## 2016-01-21 ENCOUNTER — Ambulatory Visit (INDEPENDENT_AMBULATORY_CARE_PROVIDER_SITE_OTHER): Payer: Commercial Managed Care - HMO | Admitting: Student

## 2016-01-21 ENCOUNTER — Encounter: Payer: Self-pay | Admitting: Student

## 2016-01-21 VITALS — BP 138/72 | HR 83 | Temp 97.9°F | Ht <= 58 in | Wt 151.4 lb

## 2016-01-21 DIAGNOSIS — R059 Cough, unspecified: Secondary | ICD-10-CM

## 2016-01-21 DIAGNOSIS — Z1159 Encounter for screening for other viral diseases: Secondary | ICD-10-CM | POA: Diagnosis not present

## 2016-01-21 DIAGNOSIS — I10 Essential (primary) hypertension: Secondary | ICD-10-CM | POA: Diagnosis not present

## 2016-01-21 DIAGNOSIS — M810 Age-related osteoporosis without current pathological fracture: Secondary | ICD-10-CM

## 2016-01-21 DIAGNOSIS — E785 Hyperlipidemia, unspecified: Secondary | ICD-10-CM

## 2016-01-21 DIAGNOSIS — R05 Cough: Secondary | ICD-10-CM

## 2016-01-21 LAB — BASIC METABOLIC PANEL WITH GFR
BUN: 12 mg/dL (ref 7–25)
CALCIUM: 9.2 mg/dL (ref 8.6–10.4)
CO2: 31 mmol/L (ref 20–31)
Chloride: 103 mmol/L (ref 98–110)
Creat: 0.62 mg/dL (ref 0.50–0.99)
GFR, Est African American: >89 mL/min (ref >=60)
GLUCOSE: 96 mg/dL (ref 65–99)
POTASSIUM: 3.4 mmol/L — AB (ref 3.5–5.3)
Sodium: 142 mmol/L (ref 135–146)

## 2016-01-21 MED ORDER — ALENDRONATE SODIUM 70 MG PO TABS: 70.0000 mg | ORAL_TABLET | ORAL | 11 refills | Status: DC

## 2016-01-21 NOTE — Assessment & Plan Note (Addendum)
Start alendronate today. Patient has no esophagitis or renal disease. Advised her to take this medication with a glass full of water in the morning before breakfast and stay upright for 30-60 minutes after swallowing the medicine. Also advised her to continue her Centrum Silver.  BMP today. Need to repeat DEXA scan in 2 years.

## 2016-01-21 NOTE — Patient Instructions (Signed)
It was great seeing you today! We have addressed the following issues today  1. Blood pressure: Your blood pressure is 138/72 which is good. Goal blood pressure is less than 150/90. Continue taking the medication 2. Osteoporosis (bone thinning): I have sent a prescription to the pharmacy. Take this medication once a week in the morning before breakfast with a glass full of water. Stay upright for 30-60 minutes after taking this medication 3. Cough: This is likely a viral infection. I recommend adequate hydration with water or Gatorade. I also recommend tablespoonful of honey before bedtime. Please come back and see [place] if your symptoms get worse or if you have shortness of breath, chest pain or fever.    If we did any lab work today, and the results require attention, either me or my nurse will get in touch with you. If everything is normal, you will get a letter in mail. If you don't hear from [place] in two weeks, please give [place] a call. Otherwise, I look forward to talking with you again at our next visit. If you have any questions or concerns before then, please call the clinic at [phone].  Please bring all your medications to every doctors visit   Sign up for My Chart to have easy access to your labs results, and communication with your Primary care physician.    Please check-out at the front desk before leaving the clinic.   Take Care,

## 2016-01-21 NOTE — Assessment & Plan Note (Addendum)
Blood pressure is at goal today. We will check BMP. Continue amlodipine.

## 2016-01-21 NOTE — Progress Notes (Signed)
   Subjective:    Patient ID: Theresa Duran is a 70 y.o. old female. The interpreter with ID [id number] was used for this encounter.  Patient here for follow-up on hypertension. We increased her amlodipine from 5 mg to 10 mg 2 weeks ago. Also have a concern about a cough.   HPI #Hypertension: Reports taking and tolerating amlodipine 10 mg.   #Cough: for three days. Productive with yellow phlegm and blood streak. Reports sore throat as well. Denies runny nose or congestion, fever, chills, night sweat, shortness of breath, chest pain, nasea or vomiting.   Denies sick contact.   #Osteoporosis: DEXA scan about 5 months ago with T score of -2.7 for femur neck and -2.6 for her spine. She denies heartburn. Her renal function is normal. She does take Centrum Silver over-the-counter.   PMH: reviewed  SH: Denies smoking.   Review of Systems Per HPI Objective:   Vitals:   01/21/16 0933  BP: 138/72  Pulse: 83  Temp: 97.9 F (36.6 C)  TempSrc: Oral  SpO2: 99%  Weight: 151 lb 6.4 oz (68.7 kg)  Height: 4\' 9"  (1.448 m)    GEN: appears well, no apparent distress. HEENT:   Head: normocephalic and atraumatic   Eyes: without conjunctival injection, sclera anicteric  Ears: normal TM and ear canal,   Nares: no rhinorrhea, congestion or erythema,   Oropharynx: mmm without erythema or exudation CVS: RRR, normal s1 and s2, no murmurs, no edema RESP: no increased work of breathing, good air movement bilaterally, no crackles or wheeze GI: Bowel sounds present and normal, soft, non-tender,non-distended HEM: No cervical lymphadenopathy NEURO: alert and oriented appropriately, no gross defecits  PSYCH: appropriate mood and affect     Assessment & Plan:  Essential hypertension, benign Blood pressure is at goal today. We will check BMP. Continue amlodipine.   Cough Likely viral infection/allergy. She has no constitutional symptoms. Otherwise, exam is reassuring. Supportive management with good  hydration and a tablespoonful of honey before bedtime. If no improvement over the next 2-3 weeks, I'll at least consider x-ray given age and small hemoptysis.   Osteoporosis Start alendronate today. Patient has no esophagitis or renal disease. Advised her to take this medication with a glass full of water in the morning before breakfast and stay upright for 30-60 minutes after swallowing the medicine. Also advised her to continue her Centrum Silver.  BMP today. Need to repeat DEXA scan in 2 years.

## 2016-01-21 NOTE — Assessment & Plan Note (Signed)
Likely viral infection/allergy. She has no constitutional symptoms. Otherwise, exam is reassuring. Supportive management with good hydration and a tablespoonful of honey before bedtime. If no improvement over the next 2-3 weeks, I'll at least consider x-ray given age and small hemoptysis.

## 2016-01-22 ENCOUNTER — Encounter: Payer: Self-pay | Admitting: Student

## 2016-01-22 LAB — HEPATITIS C ANTIBODY: HCV Ab: NEGATIVE

## 2016-01-22 NOTE — Progress Notes (Signed)
Results letter. BMP within normal limits except for potassium of 3.4. Advised patient to eat beans and green leafy vegetables.

## 2016-01-27 ENCOUNTER — Ambulatory Visit: Payer: Commercial Managed Care - HMO | Admitting: Student

## 2016-03-08 ENCOUNTER — Other Ambulatory Visit: Payer: Self-pay | Admitting: *Deleted

## 2016-03-08 MED ORDER — SIMVASTATIN 40 MG PO TABS: 40.0000 mg | ORAL_TABLET | Freq: Every day | ORAL | 3 refills | Status: DC

## 2016-03-15 DIAGNOSIS — H35343 Macular cyst, hole, or pseudohole, bilateral: Secondary | ICD-10-CM | POA: Diagnosis not present

## 2016-03-15 DIAGNOSIS — H4423 Degenerative myopia, bilateral: Secondary | ICD-10-CM | POA: Diagnosis not present

## 2016-03-15 DIAGNOSIS — H35373 Puckering of macula, bilateral: Secondary | ICD-10-CM | POA: Diagnosis not present

## 2016-11-03 ENCOUNTER — Other Ambulatory Visit: Payer: Self-pay | Admitting: Student

## 2016-11-03 ENCOUNTER — Ambulatory Visit (INDEPENDENT_AMBULATORY_CARE_PROVIDER_SITE_OTHER): Payer: Medicare HMO | Admitting: Student

## 2016-11-03 ENCOUNTER — Encounter: Payer: Self-pay | Admitting: Student

## 2016-11-03 VITALS — BP 124/62 | HR 95 | Temp 98.2°F | Ht <= 58 in | Wt 153.0 lb

## 2016-11-03 DIAGNOSIS — M7989 Other specified soft tissue disorders: Secondary | ICD-10-CM | POA: Diagnosis not present

## 2016-11-03 DIAGNOSIS — L84 Corns and callosities: Secondary | ICD-10-CM | POA: Diagnosis not present

## 2016-11-03 DIAGNOSIS — M79672 Pain in left foot: Secondary | ICD-10-CM

## 2016-11-03 DIAGNOSIS — K219 Gastro-esophageal reflux disease without esophagitis: Secondary | ICD-10-CM | POA: Diagnosis not present

## 2016-11-03 DIAGNOSIS — I10 Essential (primary) hypertension: Secondary | ICD-10-CM | POA: Diagnosis not present

## 2016-11-03 DIAGNOSIS — R059 Cough, unspecified: Secondary | ICD-10-CM

## 2016-11-03 DIAGNOSIS — M79671 Pain in right foot: Secondary | ICD-10-CM | POA: Insufficient documentation

## 2016-11-03 DIAGNOSIS — R05 Cough: Secondary | ICD-10-CM | POA: Diagnosis not present

## 2016-11-03 MED ORDER — HYDROCHLOROTHIAZIDE 12.5 MG PO TABS: 12.5000 mg | ORAL_TABLET | Freq: Every day | ORAL | 0 refills | Status: DC

## 2016-11-03 MED ORDER — OMEPRAZOLE 20 MG PO CPDR: 20.0000 mg | DELAYED_RELEASE_CAPSULE | Freq: Every day | ORAL | 3 refills | Status: DC

## 2016-11-03 NOTE — Progress Notes (Addendum)
Subjective:    Theresa Duran is a 71 y.o. old female here for leg swelling, foot pain and cough. [employer] interpret[employer] was used for this encounter.   HPI  Leg swelling: bilateral. This has been going on for a year. She says her legs swell when she is on her feet for long time. She says the swelling is pitting when she presses it with her finger. She denies skin erythema, calf tenderness, fever, palpitation, dizziness, chest pain, shortness of breath or orthopnea.  Feet pain: bilateral. Pain is over the head of left 5th metatarsal bone over the planar aspect and distal end of her second metatarsal bone over the plantar aspect. It hurts when she is on her foot for long time. This is also going on for about a year. She denies history of injury or trauma.   Cough: this has been going on for two months. Cough is dry. Cough is worse at night. Reports acid reflex. Denies environmental allergy or postnasal dripping. Denies fever, night sweat, recent travel, chest pain, shortness of breath or orthopnea. Doesn't consume sweat stuff. Denies going to bed right after eating. She says she usually eat her dinner about 6 pm and go for walk.   PMH/Problem List: has Hyperlipidemia; Cataract; Low back pain; Muscle cramps at night; Cough; Osteoarthritis of left knee; Crystalluria; Essential hypertension, benign; Personal history of colonic polyps-adenoma; UTI (urinary tract infection); Urinary urgency; Cystocele; Osteoporosis; Chest pain; Routine adult health maintenance; Pain in both feet; and Leg swelling on her problem list.   has a past medical history of Hyperlipidemia and Personal history of colonic polyps-adenoma (05/14/2013).  FH:  Family History  Problem Relation Age of Onset  . Hypertension Neg Hx   . Heart disease Neg Hx   . Colon cancer Neg Hx     SH Social History  Substance Use Topics  . Smoking status: Never Smoker  . Smokeless tobacco: Never Used  . Alcohol use No    Review of Systems Review of  systems negative except for pertinent positives and negatives in history of present illness above.     Objective:     Vitals:   11/03/16 1505  BP: 124/62  Pulse: 95  Temp: 98.2 F (36.8 C)  TempSrc: Oral  SpO2: 98%  Weight: 153 lb (69.4 kg)  Height: 4\' 9"  (1.448 m)    Physical Exam GEN: appears well, no apparent distress. CVS: RRR, nl S1&S2, no murmurs, trace edema RESP: no IWOB, good air movement bilaterally, CTAB GI: BS present & normal, soft, NTND MSK: Foot and leg exam:  No bony deformities, inflammation, or tenderness in bony prominences. Noted area of hyperkeratinized skin over the plantar aspect of head of left 5th metatarsal head and distal end of right second metatarsal bone where she reports pain with prolonged standing. Full ROM dorsi/plantar flexion, inversion & eversion. Tinel's test negative. Neurovascularly intact for mild trace edema bilaterally.   SKIN: as above NEURO: alert and oiented appropriately, no gross deficits     Assessment and Plan:  Leg swelling Likely due to her amlodipine. Will switch to HCTZ. Start at 12.5 mg daily and adjust as needed. This will help osteoporosis as well. BMP today and when she returns in two weeks.  Pain in both feet Looks like calluses vs wart. She will benefit from good foot wear and shoe insert. Initially, gave her phone number to sport medicine and advised her to call to set up an appointment. However, patient might not be able to make  this appointment due to language barrier. So, will order referral to podiatry.   Cough Likely related to GERD. Her cough is worse at night, and she reports acid reflux. Gave Rx for Omeprazole 20 mg daily. This is not ideal with her osteoporosis but will try short course.   Essential hypertension, benign BP at goal. Changed amlodipine to HCTZ due to leg edema.   Orders Placed This Encounter  Procedures  . Basic metabolic panel  . Ambulatory referral to [facility] ordered this  encounter  Medications  . Ascorbic Acid (VITAMIN C) 1000 MG tablet    Sig: Take by mouth.  . DISCONTD: hydrochlorothiazide (HYDRODIURIL) 12.5 MG tablet    Sig: Take 1 tablet (12.5 mg total) by mouth daily.    Dispense:  30 tablet    Refill:  0  . omeprazole (PRILOSEC) 20 MG capsule    Sig: Take 1 capsule (20 mg total) by mouth daily.    Dispense:  30 capsule    Refill:  3   Return in about 2 weeks (around 11/17/2016) for HTN.  Mercy Riding, MD 11/03/16 Pager: [phone]  Addendum:  -Ambulatory referral to podiatry

## 2016-11-03 NOTE — Assessment & Plan Note (Signed)
Likely related to GERD. Her cough is worse at night, and she reports acid reflux. Gave Rx for Omeprazole 20 mg daily. This is not ideal with her osteoporosis but will try short course.

## 2016-11-03 NOTE — Patient Instructions (Addendum)
It was great seeing you today! We have addressed the following issues today 1. Leg swelling: This is likely due to your blood pressure medication, amlodipine. I have changing your blood pressure medicine to hydrochlorothiazide. Will recommend he come back and see [place] in two weeks for blood pressure check.  2.   Foot pain: Please call the number we gave you to schedule an appointment at sports medicine. They may recommend proper shoes and shoe inserts 3.  Cough: this is likely due to acid reflux. I have sent a prescription for omeprazole your pharmacy.  4.  Please have the alendronate filled and resume taking.   If we did any lab work today, and the results require attention, either me or my nurse will get in touch with you. If everything is normal, you will get a letter in mail and a message via . If you don't hear from [place] in two weeks, please give [place] a call. Otherwise, we look forward to seeing you again at your next visit. If you have any questions or concerns before then, please call the clinic at [phone].  Please bring all your medications to every doctors visit  Sign up for My Chart to have easy access to your labs results, and communication with your Primary care physician.    Please check-out at the front desk before leaving the clinic.    Take Care,   Dr. Cyndia Skeeters   Food Choices for Gastroesophageal Reflux Disease, Adult When you have gastroesophageal reflux disease (GERD), the foods you eat and your eating habits are very important. Choosing the right foods can help ease your discomfort. What guidelines do I need to follow?  Choose fruits, vegetables, whole grains, and low-fat dairy products.  Choose low-fat meat, fish, and poultry.  Limit fats such as oils, salad dressings, butter, nuts, and avocado.  Keep a food diary. This helps you identify foods that cause symptoms.  Avoid foods that cause symptoms. These may be different for everyone.  Eat small meals often  instead of 3 large meals a day.  Eat your meals slowly, in a place where you are relaxed.  Limit fried foods.  Cook foods using methods other than frying.  Avoid drinking alcohol.  Avoid drinking large amounts of liquids with your meals.  Avoid bending over or lying down until 2-3 hours after eating. What foods are not recommended? These are some foods and drinks that may make your symptoms worse: Vegetables Tomatoes. Tomato juice. Tomato and spaghetti sauce. Chili peppers. Onion and garlic. Horseradish. Fruits Oranges, grapefruit, and lemon (fruit and juice). Meats High-fat meats, fish, and poultry. This includes hot dogs, ribs, ham, sausage, salami, and bacon. Dairy Whole milk and chocolate milk. Sour cream. Cream. Butter. Ice cream. Cream cheese. Drinks Coffee and tea. Bubbly (carbonated) drinks or energy drinks. Condiments Hot sauce. Barbecue sauce. Sweets/Desserts Chocolate and cocoa. Donuts. Peppermint and spearmint. Fats and Oils High-fat foods. This includes [place] fries and potato chips. Other Vinegar. Strong spices. This includes black pepper, white pepper, red pepper, cayenne, curry powder, cloves, ginger, and chili powder. The items listed above may not be a complete list of foods and drinks to avoid. Contact your dietitian for more information. This information is not intended to replace advice given to you by your health care provider. Make sure you discuss any questions you have with your health care provider. Document Released: 09/28/2011 Document Revised: 09/04/2015 Document Reviewed: 01/31/2013 Elsevier Interactive Patient Education  2017 [employer].

## 2016-11-03 NOTE — Assessment & Plan Note (Signed)
BP at goal. Changed amlodipine to HCTZ due to leg edema.

## 2016-11-03 NOTE — Assessment & Plan Note (Addendum)
Looks like calluses vs wart. She will benefit from good foot wear and shoe insert. Initially, gave her phone number to sport medicine and advised her to call to set up an appointment. However, patient might not be able to make this appointment due to language barrier. So, will order referral to podiatry.

## 2016-11-03 NOTE — Addendum Note (Signed)
Addended by: Wendee Beavers T on: 11/03/2016 11:16 PM   Modules accepted: Orders

## 2016-11-03 NOTE — Assessment & Plan Note (Signed)
Likely due to her amlodipine. Will switch to HCTZ. Start at 12.5 mg daily and adjust as needed. This will help osteoporosis as well. BMP today and when she returns in two weeks.

## 2016-11-04 ENCOUNTER — Encounter: Payer: Self-pay | Admitting: Student

## 2016-11-04 ENCOUNTER — Telehealth: Payer: Self-pay | Admitting: Student

## 2016-11-04 LAB — BASIC METABOLIC PANEL
BUN/Creatinine Ratio: 19 (ref 12–28)
BUN: 17 mg/dL (ref 8–27)
CALCIUM: 9.6 mg/dL (ref 8.7–10.3)
CO2: 27 mmol/L (ref 20–29)
CREATININE: 0.9 mg/dL (ref 0.57–1.00)
Chloride: 106 mmol/L (ref 96–106)
GFR calc Af Amer: 75 mL/min/{1.73_m2} (ref >59)
GFR calc non Af Amer: 65 mL/min/{1.73_m2} (ref >59)
GLUCOSE: 119 mg/dL — AB (ref 65–99)
Potassium: 3.8 mmol/L (ref 3.5–5.2)
SODIUM: 148 mmol/L — AB (ref 134–144)

## 2016-11-04 NOTE — Telephone Encounter (Signed)
Called and talked to patient using [place] interpreter with ID [phone] and told her that someone will call her about the referral to podiatry/foot doctor in the next couple of weeks. Patient voiced understanding and appreciated the call

## 2016-11-04 NOTE — Progress Notes (Signed)
BMP with mild hypernatremia to 148. Not on medication that causes hypernatremia. Will repeat BMP at her next office visit.

## 2016-11-23 ENCOUNTER — Encounter: Payer: Self-pay | Admitting: Student

## 2016-11-23 ENCOUNTER — Ambulatory Visit (INDEPENDENT_AMBULATORY_CARE_PROVIDER_SITE_OTHER): Payer: Medicare HMO | Admitting: Student

## 2016-11-23 VITALS — BP 128/62 | HR 83 | Temp 97.9°F | Ht <= 58 in | Wt 151.0 lb

## 2016-11-23 DIAGNOSIS — E785 Hyperlipidemia, unspecified: Secondary | ICD-10-CM | POA: Diagnosis not present

## 2016-11-23 DIAGNOSIS — R059 Cough, unspecified: Secondary | ICD-10-CM

## 2016-11-23 DIAGNOSIS — I10 Essential (primary) hypertension: Secondary | ICD-10-CM

## 2016-11-23 DIAGNOSIS — R05 Cough: Secondary | ICD-10-CM | POA: Diagnosis not present

## 2016-11-23 DIAGNOSIS — R739 Hyperglycemia, unspecified: Secondary | ICD-10-CM | POA: Diagnosis not present

## 2016-11-23 LAB — POCT GLYCOSYLATED HEMOGLOBIN (HGB A1C): HEMOGLOBIN A1C: 5.7

## 2016-11-23 MED ORDER — BENZONATATE 100 MG PO CAPS: 100.0000 mg | ORAL_CAPSULE | Freq: Two times a day (BID) | ORAL | 0 refills | Status: DC | PRN

## 2016-11-23 NOTE — Progress Notes (Signed)
Subjective:    Theresa Duran is a 71 y.o. old female here for follow up on hypertension and cough Video interpreter with [place] [phone] was used for this encounter. HPI Hypertension: Changed from Amlodipine to HCTZ at her previous visit about a month ago. Blood pressure is well controlled on HCTZ. She reports good compliance with her medication. She is tolerating it well.   Cough: this now going on for three months. She has tried Omeprazole for one month now. It helped with the heart burn but not with cough. Cough is dry. She denies fever, chest pain, shortness of breath,  recent travel outside the country or contact with someone from other country. Denies orthopnea and PND. No history of CHF or COPD. She never smoked. Denies unintentional weight loss, night sweat or LAD. Denies history of seasonal allergy, frequent sneezing, itchy eyes or postnasal drip.   PMH/Problem List: has Hyperlipidemia; Cataract; Low back pain; Muscle cramps at night; Cough; Osteoarthritis of left knee; Crystalluria; Essential hypertension, benign; Personal history of colonic polyps-adenoma; UTI (urinary tract infection); Urinary urgency; Cystocele; Osteoporosis; Chest pain; Routine adult health maintenance; Pain in both feet; and Leg swelling on her problem list.   has a past medical history of Hyperlipidemia and Personal history of colonic polyps-adenoma (05/14/2013).  FH:  Family History  Problem Relation Age of Onset  . Hypertension Neg Hx   . Heart disease Neg Hx   . Colon cancer Neg Hx     SH Social History  Substance Use Topics  . Smoking status: Never Smoker  . Smokeless tobacco: Never Used  . Alcohol use No    Review of Systems Review of systems negative except for pertinent positives and negatives in history of present illness above.     Objective:     Vitals:   11/23/16 1344  BP: 128/62  Pulse: 83  Temp: 97.9 F (36.6 C)  TempSrc: Oral  SpO2: 98%  Weight: 151 lb (68.5 kg)  Height: 4\' 9"  (1.448 m)     Physical Exam GEN: appears well, no apparent distress. Head: normocephalic and atraumatic  Eyes: conjunctiva without injection, sclera anicteric Oropharynx: mmm without erythema or exudation HEM: negative for cervical or periauricular lymphadenopathies ENDO: negative thyromegally CVS: RRR, nl S1&S2, no murmurs, no edema RESP: no IWOB, good air movement bilaterally, CTAB GI: BS present & normal, soft, NTND MSK: no focal tenderness or notable swelling SKIN: no apparent skin lesion NEURO: alert and oiented appropriately, no gross deficits  PSYCH: euthymic mood with congruent affect    Assessment and Plan:  Essential hypertension, benign Well controlled. Will continue HCTZ. Check BMP today.  Cough Unclear etiology. No signs and symptoms of infectious process. No history of CHF or signs of fluid overload. Will continue Omeprazole. Will add tessalon. Also recommended good hydration and a tablespoonful of honey at bedtime. If no improvement with these measures, will obtain chest X-ray.   Hyperlipidemia ASCVD risk score 11.5% based on previous lipid panel. Continue her Zocor and ASA, and recheck lipid panel today.  Orders Placed This Encounter  Procedures  . Basic metabolic panel  . Lipid panel  . HgB A1c   Meds ordered this encounter  Medications  . benzonatate (TESSALON) 100 MG capsule    Sig: Take 1 capsule (100 mg total) by mouth 2 (two) times daily as needed for cough.    Dispense:  20 capsule    Refill:  0   Return in about 6 months (around 05/26/2017) for HTN.  Mercy Riding,  MD 11/24/16 Pager: [phone]

## 2016-11-23 NOTE — Patient Instructions (Addendum)
It was great seeing you today! We have addressed the following issues today 1. Blood pressure: Your blood pressure is 128/62. Your goal blood pressure is less than 140/90. Continue taking your medication. Follow-up in 6 months. You may come back to get to flu vaccines late August or early September.  2.  Cough: I recommend trying a tablespoonful of honey before bedtime. I have also sent a medication to the pharmacy to see if this helps. If no improvement over the next 2-3 weeks, please let us know.   If we did any lab work today, and the results require attention, either me or my nurse will get in touch with you. If everything is normal, you will get a letter in mail and a message via . If you don't hear from [place] in two weeks, please give [place] a call. Otherwise, we look forward to seeing you again at your next visit. If you have any questions or concerns before then, please call the clinic at [phone].  Please bring all your medications to every doctors visit  Sign up for My Chart to have easy access to your labs results, and communication with your Primary care physician.    Please check-out at the front desk before leaving the clinic.    Take Care,   Dr. Cyndia Skeeters

## 2016-11-24 ENCOUNTER — Other Ambulatory Visit: Payer: Self-pay | Admitting: Student

## 2016-11-24 ENCOUNTER — Telehealth: Payer: Self-pay | Admitting: Student

## 2016-11-24 ENCOUNTER — Encounter: Payer: Self-pay | Admitting: Student

## 2016-11-24 DIAGNOSIS — I10 Essential (primary) hypertension: Secondary | ICD-10-CM

## 2016-11-24 DIAGNOSIS — E876 Hypokalemia: Secondary | ICD-10-CM

## 2016-11-24 LAB — BASIC METABOLIC PANEL
BUN / CREAT RATIO: 19 (ref 12–28)
BUN: 15 mg/dL (ref 8–27)
CO2: 27 mmol/L (ref 20–29)
CREATININE: 0.8 mg/dL (ref 0.57–1.00)
Calcium: 9.3 mg/dL (ref 8.7–10.3)
Chloride: 102 mmol/L (ref 96–106)
GFR, EST AFRICAN AMERICAN: 86 mL/min/{1.73_m2} (ref >59)
GFR, EST NON AFRICAN AMERICAN: 75 mL/min/{1.73_m2} (ref >59)
Glucose: 145 mg/dL — ABNORMAL HIGH (ref 65–99)
POTASSIUM: 3 mmol/L — AB (ref 3.5–5.2)
Sodium: 143 mmol/L (ref 134–144)

## 2016-11-24 LAB — LIPID PANEL
Chol/HDL Ratio: 3.3 ratio (ref 0.0–4.4)
Cholesterol, Total: 152 mg/dL (ref 100–199)
HDL: 46 mg/dL (ref >39)
LDL CALC: 69 mg/dL (ref 0–99)
Triglycerides: 185 mg/dL — ABNORMAL HIGH (ref 0–149)
VLDL Cholesterol Cal: 37 mg/dL (ref 5–40)

## 2016-11-24 MED ORDER — POTASSIUM CHLORIDE CRYS ER 20 MEQ PO TBCR: 20.0000 meq | EXTENDED_RELEASE_TABLET | Freq: Every day | ORAL | 0 refills | Status: DC

## 2016-11-24 NOTE — Assessment & Plan Note (Addendum)
ASCVD risk score 11.5% based on previous lipid panel. Continue her Zocor and ASA, and recheck lipid panel today.

## 2016-11-24 NOTE — Assessment & Plan Note (Signed)
Well controlled. Will continue HCTZ. Check BMP today.

## 2016-11-24 NOTE — Telephone Encounter (Signed)
Called to discuss about patient's BMP from her recent visit with me. Her daughter picked up the phone. Discussed with the daughter that patent's K is low at 3.0. I told her that I am sending a prescription for KCl 20 mEq #6 to the pharmacy. Advised her to pick up the prescription for her mother so that she can start taking it as soon as possible. I have also ordered repeat BMP in two weeks and advised her mother to come in for blood draw. She voiced understanding and appreciated the call.

## 2016-11-24 NOTE — Assessment & Plan Note (Signed)
Unclear etiology. No signs and symptoms of infectious process. No history of CHF or signs of fluid overload. Will continue Omeprazole. Will add tessalon. Also recommended good hydration and a tablespoonful of honey at bedtime. If no improvement with these measures, will obtain chest X-ray.

## 2016-11-29 ENCOUNTER — Ambulatory Visit (INDEPENDENT_AMBULATORY_CARE_PROVIDER_SITE_OTHER): Payer: Medicare HMO | Admitting: Podiatry

## 2016-11-29 ENCOUNTER — Encounter: Payer: Self-pay | Admitting: Podiatry

## 2016-11-29 DIAGNOSIS — M7751 Other enthesopathy of right foot: Secondary | ICD-10-CM | POA: Diagnosis not present

## 2016-11-29 DIAGNOSIS — L84 Corns and callosities: Secondary | ICD-10-CM

## 2016-11-29 MED ORDER — BETAMETHASONE SOD PHOS & ACET 6 (3-3) MG/ML IJ SUSP: 3.0000 mg | Freq: Once | INTRAMUSCULAR | Status: DC

## 2016-11-29 NOTE — Progress Notes (Signed)
   HPI: 71 year old female originally from [place] presents today for evaluation of pain to the right plantar lateral foot. Patient states that for the past 6 months she has significant pain due to a callus lesion on the bottom of the right foot. Patient denies trauma. Patient states she had a gradual onset. It's very painful if standing for long periods of time. She presents today for further treatment and evaluation. Patient presents today with a translator present   Physical Exam: General: The patient is alert and oriented x3 in no acute distress.  Dermatology: Hyperkeratotic callus tissue noted to the plantar aspect of the fifth metatarsal tubercle as well as sub-second MTPJ both on the right foot. Skin is warm, dry and supple bilateral lower extremities. Negative for open lesions or macerations.  Vascular: Palpable pedal pulses bilaterally. No edema or erythema noted. Capillary refill within normal limits.  Neurological: Epicritic and protective threshold grossly intact bilaterally.   Musculoskeletal Exam: There is a very fluctuant palpable soft tissue mass noted to the plantar aspect of the fifth metatarsal tubercle of the right foot consistent with adventitious bursa. Moderate pain on palpation noted as well. Range of motion within normal limits to all pedal and ankle joints bilateral. Muscle strength 5/5 in all groups bilateral.   Radiographic Exam:  Normal osseous mineralization. Joint spaces preserved. No fracture/dislocation/boney destruction.    Assessment: 1. Symptomatic callus lesion 2 right foot 2. Adventitious bursa right foot   Plan of Care:  1. Patient was evaluated. 2. Excisional debridement of the callus lesions was performed using a chisel blade without incident or bleeding 3. Injection of 0.5 mL Celestone Soluspan injected into the adventitious bursa of the right foot to alleviate symptoms 4. Return to clinic when necessary   Edrick Kins, DPM [facility]  Dr. Edrick Kins, DPM    [address], [place] [address]                Office [phone]  Fax [phone]

## 2016-12-30 ENCOUNTER — Other Ambulatory Visit: Payer: Self-pay | Admitting: Student

## 2016-12-30 DIAGNOSIS — I10 Essential (primary) hypertension: Secondary | ICD-10-CM

## 2017-01-01 NOTE — Telephone Encounter (Signed)
I received a refill request on amlodipine from her pharmacy. However, patient is supposed to be on HCTZ as of her last encounter with me about a month ago. When I called to verify this, her daughter picked up. However, she is not sure which blood pressure medicine she is taking. She is on the road right now. Advised her to call and let us know on Monday. She voiced understanding and agrees.

## 2017-01-03 NOTE — Telephone Encounter (Signed)
Patient's daughter called regarding refill on amlodipine. Advised her that patient should not be taking amlodipine only HCTZ. Patient should discontinue medication and continue with HCTZ. Patient HCTZ at home.  Derl Barrow, RN

## 2017-01-23 ENCOUNTER — Other Ambulatory Visit: Payer: Self-pay | Admitting: Student

## 2017-01-23 DIAGNOSIS — M810 Age-related osteoporosis without current pathological fracture: Secondary | ICD-10-CM

## 2017-01-24 ENCOUNTER — Other Ambulatory Visit: Payer: Self-pay | Admitting: Student

## 2017-01-24 DIAGNOSIS — M810 Age-related osteoporosis without current pathological fracture: Secondary | ICD-10-CM

## 2017-01-31 ENCOUNTER — Other Ambulatory Visit: Payer: Self-pay | Admitting: Student

## 2017-01-31 DIAGNOSIS — I10 Essential (primary) hypertension: Secondary | ICD-10-CM

## 2017-02-01 ENCOUNTER — Other Ambulatory Visit: Payer: Self-pay | Admitting: Student

## 2017-02-01 DIAGNOSIS — I10 Essential (primary) hypertension: Secondary | ICD-10-CM

## 2017-02-01 MED ORDER — HYDROCHLOROTHIAZIDE 12.5 MG PO TABS: 12.5000 mg | ORAL_TABLET | Freq: Every day | ORAL | 3 refills | Status: DC

## 2017-02-19 ENCOUNTER — Other Ambulatory Visit: Payer: Self-pay | Admitting: Student

## 2017-03-29 DIAGNOSIS — H4423 Degenerative myopia, bilateral: Secondary | ICD-10-CM | POA: Diagnosis not present

## 2017-03-29 DIAGNOSIS — H35343 Macular cyst, hole, or pseudohole, bilateral: Secondary | ICD-10-CM | POA: Diagnosis not present

## 2017-05-03 ENCOUNTER — Other Ambulatory Visit: Payer: Self-pay | Admitting: Student

## 2017-05-03 DIAGNOSIS — E785 Hyperlipidemia, unspecified: Secondary | ICD-10-CM

## 2017-05-03 NOTE — Telephone Encounter (Signed)
The 10-year ASCVD risk score Mikey Bussing DC Brooke Bonito., et al., 2013) is: 12%   Values used to calculate the score:     Age: 72 years     Sex: Female     Is Non-Hispanic African American: No     Diabetic: No     Tobacco smoker: No     Systolic Blood Pressure: 403 mmHg     Is BP treated: Yes     HDL Cholesterol: 46 mg/dL     Total Cholesterol: 152 mg/dL

## 2017-05-06 ENCOUNTER — Other Ambulatory Visit: Payer: Self-pay | Admitting: Student

## 2017-05-06 DIAGNOSIS — K219 Gastro-esophageal reflux disease without esophagitis: Secondary | ICD-10-CM

## 2017-05-11 MED ORDER — RANITIDINE HCL 150 MG PO TABS: 150.0000 mg | ORAL_TABLET | Freq: Two times a day (BID) | ORAL | 3 refills | Status: DC

## 2017-06-13 ENCOUNTER — Encounter: Payer: Self-pay | Admitting: Student

## 2017-06-13 ENCOUNTER — Ambulatory Visit (INDEPENDENT_AMBULATORY_CARE_PROVIDER_SITE_OTHER): Payer: Medicare HMO | Admitting: Student

## 2017-06-13 ENCOUNTER — Other Ambulatory Visit: Payer: Self-pay

## 2017-06-13 VITALS — BP 126/70 | HR 77 | Temp 98.0°F | Ht <= 58 in | Wt 149.4 lb

## 2017-06-13 DIAGNOSIS — E785 Hyperlipidemia, unspecified: Secondary | ICD-10-CM | POA: Diagnosis not present

## 2017-06-13 DIAGNOSIS — Z1231 Encounter for screening mammogram for malignant neoplasm of breast: Secondary | ICD-10-CM | POA: Diagnosis not present

## 2017-06-13 DIAGNOSIS — Z1239 Encounter for other screening for malignant neoplasm of breast: Secondary | ICD-10-CM

## 2017-06-13 DIAGNOSIS — Z23 Encounter for immunization: Secondary | ICD-10-CM | POA: Diagnosis not present

## 2017-06-13 DIAGNOSIS — R05 Cough: Secondary | ICD-10-CM | POA: Diagnosis not present

## 2017-06-13 DIAGNOSIS — M810 Age-related osteoporosis without current pathological fracture: Secondary | ICD-10-CM

## 2017-06-13 DIAGNOSIS — R1010 Upper abdominal pain, unspecified: Secondary | ICD-10-CM

## 2017-06-13 DIAGNOSIS — Z0001 Encounter for general adult medical examination with abnormal findings: Secondary | ICD-10-CM | POA: Diagnosis not present

## 2017-06-13 DIAGNOSIS — Z0000 Encounter for general adult medical examination without abnormal findings: Secondary | ICD-10-CM

## 2017-06-13 DIAGNOSIS — R059 Cough, unspecified: Secondary | ICD-10-CM

## 2017-06-13 MED ORDER — ZOSTER VAC RECOMB ADJUVANTED 50 MCG/0.5ML IM SUSR: 0.5000 mL | Freq: Once | INTRAMUSCULAR | 1 refills | Status: AC

## 2017-06-13 MED ORDER — ALENDRONATE SODIUM 70 MG PO TABS: 70.0000 mg | ORAL_TABLET | ORAL | 3 refills | Status: DC

## 2017-06-13 MED ORDER — SIMVASTATIN 40 MG PO TABS: 40.0000 mg | ORAL_TABLET | Freq: Every day | ORAL | 0 refills | Status: DC

## 2017-06-13 NOTE — Patient Instructions (Addendum)
It was great seeing you today! We have addressed the following issues today  Abdominal pain/diarrhea: Your exam is within normal limits.  I do not think anything serious or life-threatening is going on.  You can continue using Imodium as needed for the diarrhea.  Scratchy throat/cough: This could be due to heartburn or acid reflux.  I recommend using your acid reflux medication daily.  I also recommend avoiding caffeinated drinks, sodas, chocolate or sweet that could trigger the acid reflux.  Osteoporosis: Please resume your alendronate.  Please do not stop this medication until you are told to.  Shingles vaccine: Please take the prescription we gave you to the pharmacy to have the shingles vaccine.  Screening for breast cancer: We ordered a mammogram.  Someone will get in touch with you about this.  You can also call the number we gave you to schedule for your appointment.   If we did any lab work today, and the results require attention, either me or my nurse will get in touch with you. If everything is normal, you will get a letter in mail and a message via . If you don't hear from [place] in two weeks, please give [place] a call. Otherwise, we look forward to seeing you again at your next visit. If you have any questions or concerns before then, please call the clinic at [phone].  Please bring all your medications to every doctors visit  Sign up for My Chart to have easy access to your labs results, and communication with your Primary care physician.    Please check-out at the front desk before leaving the clinic.    Take Care,   Dr. Cyndia Skeeters   Health Maintenance for Postmenopausal Women Menopause is a normal process in which your reproductive ability comes to an end. This process happens gradually over a span of months to years, usually between the ages of 33 and 21. Menopause is complete when you have missed 12 consecutive menstrual periods. It is important to talk with your health care  provider about some of the most common conditions that affect postmenopausal women, such as heart disease, cancer, and bone loss (osteoporosis). Adopting a healthy lifestyle and getting preventive care can help to promote your health and wellness. Those actions can also lower your chances of developing some of these common conditions. What should I know about menopause? During menopause, you may experience a number of symptoms, such as:  Moderate-to-severe hot flashes.  Night sweats.  Decrease in sex drive.  Mood swings.  Headaches.  Tiredness.  Irritability.  Memory problems.  Insomnia.  Choosing to treat or not to treat menopausal changes is an individual decision that you make with your health care provider. What should I know about hormone replacement therapy and supplements? Hormone therapy products are effective for treating symptoms that are associated with menopause, such as hot flashes and night sweats. Hormone replacement carries certain risks, especially as you become older. If you are thinking about using estrogen or estrogen with progestin treatments, discuss the benefits and risks with your health care provider. What should I know about heart disease and stroke? Heart disease, heart attack, and stroke become more likely as you age. This may be due, in part, to the hormonal changes that your body experiences during menopause. These can affect how your body processes dietary fats, triglycerides, and cholesterol. Heart attack and stroke are both medical emergencies. There are many things that you can do to help prevent heart disease and stroke:  Have your blood pressure checked at least every 1-2 years. High blood pressure causes heart disease and increases the risk of stroke.  If you are 22-47 years old, ask your health care provider if you should take aspirin to prevent a heart attack or a stroke.  Do not use any tobacco products, including cigarettes, chewing tobacco,  or electronic cigarettes. If you need help quitting, ask your health care provider.  It is important to eat a healthy diet and maintain a healthy weight. ? Be sure to include plenty of vegetables, fruits, low-fat dairy products, and lean protein. ? Avoid eating foods that are high in solid fats, added sugars, or salt (sodium).  Get regular exercise. This is one of the most important things that you can do for your health. ? Try to exercise for at least 150 minutes each week. The type of exercise that you do should increase your heart rate and make you sweat. This is known as moderate-intensity exercise. ? Try to do strengthening exercises at least twice each week. Do these in addition to the moderate-intensity exercise.  Know your numbers.Ask your health care provider to check your cholesterol and your blood glucose. Continue to have your blood tested as directed by your health care provider.  What should I know about cancer screening? There are several types of cancer. Take the following steps to reduce your risk and to catch any cancer development as early as possible. Breast Cancer  Practice breast self-awareness. ? This means understanding how your breasts normally appear and feel. ? It also means doing regular breast self-exams. Let your health care provider know about any changes, no matter how small.  If you are 2 or older, have a clinician do a breast exam (clinical breast exam or CBE) every year. Depending on your age, family history, and medical history, it may be recommended that you also have a yearly breast X-ray (mammogram).  If you have a family history of breast cancer, talk with your health care provider about genetic screening.  If you are at high risk for breast cancer, talk with your health care provider about having an MRI and a mammogram every year.  Breast cancer (BRCA) gene test is recommended for women who have family members with BRCA-related cancers. Results of  the assessment will determine the need for genetic counseling and BRCA1 and for BRCA2 testing. BRCA-related cancers include these types: ? Breast. This occurs in males or females. ? Ovarian. ? Tubal. This may also be called fallopian tube cancer. ? Cancer of the abdominal or pelvic lining (peritoneal cancer). ? Prostate. ? Pancreatic.  Cervical, Uterine, and Ovarian Cancer Your health care provider may recommend that you be screened regularly for cancer of the pelvic organs. These include your ovaries, uterus, and vagina. This screening involves a pelvic exam, which includes checking for microscopic changes to the surface of your cervix (Pap test).  For women ages 21-65, health care providers may recommend a pelvic exam and a Pap test every three years. For women ages 25-65, they may recommend the Pap test and pelvic exam, combined with testing for human papilloma virus (HPV), every five years. Some types of HPV increase your risk of cervical cancer. Testing for HPV may also be done on women of any age who have unclear Pap test results.  Other health care providers may not recommend any screening for nonpregnant women who are considered low risk for pelvic cancer and have no symptoms. Ask your health care provider if  a screening pelvic exam is right for you.  If you have had past treatment for cervical cancer or a condition that could lead to cancer, you need Pap tests and screening for cancer for at least 20 years after your treatment. If Pap tests have been discontinued for you, your risk factors (such as having a new sexual partner) need to be reassessed to determine if you should start having screenings again. Some women have medical problems that increase the chance of getting cervical cancer. In these cases, your health care provider may recommend that you have screening and Pap tests more often.  If you have a family history of uterine cancer or ovarian cancer, talk with your health care  provider about genetic screening.  If you have vaginal bleeding after reaching menopause, tell your health care provider.  There are currently no reliable tests available to screen for ovarian cancer.  Lung Cancer Lung cancer screening is recommended for adults 11-106 years old who are at high risk for lung cancer because of a history of smoking. A yearly low-dose CT scan of the lungs is recommended if you:  Currently smoke.  Have a history of at least 30 pack-years of smoking and you currently smoke or have quit within the past 15 years. A pack-year is smoking an average of one pack of cigarettes per day for one year.  Yearly screening should:  Continue until it has been 15 years since you quit.  Stop if you develop a health problem that would prevent you from having lung cancer treatment.  Colorectal Cancer  This type of cancer can be detected and can often be prevented.  Routine colorectal cancer screening usually begins at age 38 and continues through age 48.  If you have risk factors for colon cancer, your health care provider may recommend that you be screened at an earlier age.  If you have a family history of colorectal cancer, talk with your health care provider about genetic screening.  Your health care provider may also recommend using home test kits to check for hidden blood in your stool.  A small camera at the end of a tube can be used to examine your colon directly (sigmoidoscopy or colonoscopy). This is done to check for the earliest forms of colorectal cancer.  Direct examination of the colon should be repeated every 5-10 years until age 4. However, if early forms of precancerous polyps or small growths are found or if you have a family history or genetic risk for colorectal cancer, you may need to be screened more often.  Skin Cancer  Check your skin from head to toe regularly.  Monitor any moles. Be sure to tell your health care provider: ? About any new  moles or changes in moles, especially if there is a change in a mole's shape or color. ? If you have a mole that is larger than the size of a pencil eraser.  If any of your family members has a history of skin cancer, especially at a young age, talk with your health care provider about genetic screening.  Always use sunscreen. Apply sunscreen liberally and repeatedly throughout the day.  Whenever you are outside, protect yourself by wearing long sleeves, pants, a wide-brimmed hat, and sunglasses.  What should I know about osteoporosis? Osteoporosis is a condition in which bone destruction happens more quickly than new bone creation. After menopause, you may be at an increased risk for osteoporosis. To help prevent osteoporosis or the bone fractures  that can happen because of osteoporosis, the following is recommended:  If you are 76-69 years old, get at least 1,000 mg of calcium and at least 600 mg of vitamin D per day.  If you are older than age 56 but younger than age 53, get at least 1,200 mg of calcium and at least 600 mg of vitamin D per day.  If you are older than age 29, get at least 1,200 mg of calcium and at least 800 mg of vitamin D per day.  Smoking and excessive alcohol intake increase the risk of osteoporosis. Eat foods that are rich in calcium and vitamin D, and do weight-bearing exercises several times each week as directed by your health care provider. What should I know about how menopause affects my mental health? Depression may occur at any age, but it is more common as you become older. Common symptoms of depression include:  Low or sad mood.  Changes in sleep patterns.  Changes in appetite or eating patterns.  Feeling an overall lack of motivation or enjoyment of activities that you previously enjoyed.  Frequent crying spells.  Talk with your health care provider if you think that you are experiencing depression. What should I know about immunizations? It is  important that you get and maintain your immunizations. These include:  Tetanus, diphtheria, and pertussis (Tdap) booster vaccine.  Influenza every year before the flu season begins.  Pneumonia vaccine.  Shingles vaccine.  Your health care provider may also recommend other immunizations. This information is not intended to replace advice given to you by your health care provider. Make sure you discuss any questions you have with your health care provider. Document Released: 05/21/2005 Document Revised: 10/17/2015 Document Reviewed: 12/31/2014 Elsevier Interactive Patient Education  2018 [employer].

## 2017-06-13 NOTE — Progress Notes (Signed)
Subjective:   Chief Complaint  Patient presents with  . Annual Exam   Video interpreter with ID [id number] was used for this encounter. HPI Theresa Duran is a 72 y.o. old female here  for annual exam.  Concern today:  Abdominal pain: this has been going on for one month. Pain is over mid epigastric and periumbilical area. Denies nausea or emesis. Admits diarrhea for one month. She describes her diarrhea as loose stool with some mucus. Denies blood in stool or dark stool.  Sometimes 4-5 times a day. She is vegetarian.   Scratchy throat: this a is a chronic issue. It makes her cough. Cough is productive with whitish phlegm. Denies hemoptysis, fever, chest pain, shortness of breath, reflux, itchy eyes or nose. Admits occasional heart burn but denies reflux.  Changes in his/her health in the last 12 months: no Occupation: retired Wears seatbelt: yes.    The patient has regular exercise: yes but occasionally Enough vegetables and fruits: yes.  Smokes cigarette: no Drinks EtOH: no Drug use: no Patient takes ASA: no.  Patient takes vitD & Ca: yes. The patient is not sexually active.  Patient uses birth control: not applicable.  Domestic violence: no.  History of depression:no.  Patient dental home: yes.  Immunizations  Needs influenza vaccine: no. She reports having flu shot at [employer] 5 months ago  Needs HPV (Women until age 57): not applicable.  Needs Shingrix (all >14yrs of age): yes.  Needs Tdap: no.  Screening Need colon cancer screening: no. Need breast cancer ccreening: yes. Need cervical cancer Screening: no. STOP BANG >/=3 for OSA: no. Need HCV Screening: no. Need STI Screening: no. Fall in the last 12 months:no  PMH/Problem List: has Hyperlipidemia; Cataract; Low back pain; Muscle cramps at night; Cough; Osteoarthritis of left knee; Crystalluria; Essential hypertension, benign; Personal history of colonic polyps-adenoma; UTI (urinary tract infection); Urinary urgency;  Cystocele; Osteoporosis; Chest pain; Routine adult health maintenance; Pain in both feet; and Leg swelling on their problem list.   has a past medical history of Hyperlipidemia and Personal history of colonic polyps-adenoma (05/14/2013).  [facility]  Family History  Problem Relation Age of Onset  . Hypertension Neg Hx   . Heart disease Neg Hx   . Colon cancer Neg Hx    Family history of heart disease before age of 63 yrs: no. Family history of stroke: no. Family history of cancer: no.  SH Social History   Tobacco Use  . Smoking status: Never Smoker  . Smokeless tobacco: Never Used  Substance Use Topics  . Alcohol use: No  . Drug use: No     Review of Systems  Constitutional: Negative for appetite change, diaphoresis, fatigue, fever and unexpected weight change.  HENT: Negative for dental problem and trouble swallowing.   Eyes: Negative for visual disturbance.  Respiratory: Negative for cough, chest tightness and shortness of breath.   Cardiovascular: Negative for chest pain, palpitations and leg swelling.  Gastrointestinal: Positive for abdominal pain and diarrhea. Negative for anal bleeding, blood in stool, constipation, nausea, rectal pain and vomiting.  Endocrine: Negative for cold intolerance, heat intolerance and polyuria.  Genitourinary: Negative for dysuria and hematuria.  Musculoskeletal: Negative for myalgias.  Skin: Negative for rash.  Neurological: Negative for dizziness and light-headedness.  Hematological: Negative for adenopathy. Does not bruise/bleed easily.  Psychiatric/Behavioral: Negative for dysphoric mood.       Objective:   Physical Exam Vitals:   06/13/17 1012  BP: 126/70  Pulse: 77  Temp: 98  F (36.7 C)  TempSrc: Oral  SpO2: 97%  Weight: 149 lb 6.4 oz (67.8 kg)  Height: 4\' 9"  (1.448 m)   Body mass index is 32.33 kg/m.  GEN: appears well, no apparent distress. Head: normocephalic and atraumatic  Eyes: conjunctiva without injection, sclera  anicteric.  Nares: no rhinorrhea, congestion or erythema Oropharynx: mmm without erythema or exudation.  Wears dentures. HEM: negative for cervical or periauricular lymphadenopathies CVS: RRR, nl s1 & s2, no murmurs, no edema RESP: no IWOB, good air movement bilaterally, CTAB GI: BS present & normal, soft, NTND, no guarding, no rebound, no mass GU: no suprapubic or CVA tenderness MSK: no focal tenderness or notable swelling SKIN: no apparent skin lesion ENDO: negative thyromegally NEURO: alert and oiented appropriately, no gross deficits  PSYCH: euthymic mood with congruent affect    Assessment & Plan:  1. Annual physical exam: History, medication list and allergies updated.  Exam within normal limits.  She is up-to-date on health maintenances except mammogram, which we ordered today.  Discussed about lifestyle change particularly exercise.  2. Pain of upper abdomen with diarrhea: She has no constitutional symptoms.  No blood in his stool.  Diarrhea resolves with as needed Imodium.  Abdominal exam very benign.    She had a colonoscopy about 4 years ago which showed sessile polyps which are resected.  Pathology showed tubular adenoma without high-grade dysplasia. Reassured her about the abdominal exam finding.  Recommended continuing the Imodium.  3. Cough: Likely due to acid reflux.  She reports having heartburn.  She takes Zantac as needed.  No constitutional symptoms to suggest infectious etiology.  She is not on medication that could contribute.  Recommended using his Zantac daily.   4. Osteoporosis, unspecified osteoporosis type, unspecified pathological fracture presence: He says she has not been taking alendronate for long time.  He did know she was supposed to be taking these continuously.  Refilled her alendronate today.  Discussed the importance of taking this medication. - alendronate (FOSAMAX) 70 MG tablet; Take 1 tablet (70 mg total) by mouth once a week. Take with a full glass of  water on an empty stomach.  Dispense: 12 tablet; Refill: 3  5.  Hyperlipidemia, unspecified hyperlipidemia type: Refilled his simvastatin today. - simvastatin (ZOCOR) 40 MG tablet; Take 1 tablet (40 mg total) by mouth at bedtime.  Dispense: 90 tablet; Refill: 0  6. Screening for breast cancer - MM Digital Screening; Future  7. Encounter for immunization: Had Zostavax about 2 years ago.  She also reports having flu vaccine at her pharmacy about 5 months ago. - Zoster Vaccine Adjuvanted [facility]) injection; Inject 0.5 mLs into the muscle once for 1 dose. Repeat dose in 2 to 6 months.  Dispense: 0.5 mL; Refill: [place] PGY-3 Pager [phone] 06/13/17  1:31 PM

## 2017-07-01 ENCOUNTER — Other Ambulatory Visit: Payer: Self-pay | Admitting: Student

## 2017-07-01 DIAGNOSIS — Z1231 Encounter for screening mammogram for malignant neoplasm of breast: Secondary | ICD-10-CM

## 2017-07-21 ENCOUNTER — Ambulatory Visit
Admission: RE | Admit: 2017-07-21 | Discharge: 2017-07-21 | Disposition: A | Discharge status: Home / Self Care | Payer: Medicare HMO | Source: Ambulatory Visit | Attending: Family Medicine | Admitting: Family Medicine

## 2017-07-21 DIAGNOSIS — Z1231 Encounter for screening mammogram for malignant neoplasm of breast: Secondary | ICD-10-CM | POA: Diagnosis not present

## 2017-09-02 DIAGNOSIS — H4423 Degenerative myopia, bilateral: Secondary | ICD-10-CM | POA: Diagnosis not present

## 2017-09-02 DIAGNOSIS — H35343 Macular cyst, hole, or pseudohole, bilateral: Secondary | ICD-10-CM | POA: Diagnosis not present

## 2017-10-19 ENCOUNTER — Other Ambulatory Visit: Payer: Self-pay

## 2017-10-19 DIAGNOSIS — E785 Hyperlipidemia, unspecified: Secondary | ICD-10-CM

## 2017-10-20 MED ORDER — SIMVASTATIN 40 MG PO TABS: 40.0000 mg | ORAL_TABLET | Freq: Every day | ORAL | 3 refills | Status: DC

## 2018-01-06 ENCOUNTER — Encounter: Payer: Self-pay | Admitting: Student in an Organized Health Care Education/Training Program

## 2018-01-06 ENCOUNTER — Other Ambulatory Visit: Payer: Self-pay

## 2018-01-06 ENCOUNTER — Other Ambulatory Visit: Payer: Self-pay | Admitting: Student in an Organized Health Care Education/Training Program

## 2018-01-06 ENCOUNTER — Ambulatory Visit (INDEPENDENT_AMBULATORY_CARE_PROVIDER_SITE_OTHER): Payer: Medicare HMO | Admitting: Student in an Organized Health Care Education/Training Program

## 2018-01-06 VITALS — BP 130/76 | HR 74 | Temp 98.2°F | Ht <= 58 in | Wt 150.0 lb

## 2018-01-06 DIAGNOSIS — K219 Gastro-esophageal reflux disease without esophagitis: Secondary | ICD-10-CM | POA: Diagnosis not present

## 2018-01-06 MED ORDER — OMEPRAZOLE 20 MG PO CPDR: 20.0000 mg | DELAYED_RELEASE_CAPSULE | Freq: Two times a day (BID) | ORAL | 0 refills | Status: DC

## 2018-01-06 NOTE — Patient Instructions (Addendum)
It was a pleasure seeing you today in our clinic.   Please come back and see [place] in one month if symptoms have not improved. If symptoms worsen please let us know.  Our clinic's number is [phone]. Please call with questions or concerns about what we discussed today.  Be well, Dr. Burr Medico

## 2018-01-06 NOTE — Progress Notes (Signed)
   CC: Epigastric abdominal pain  HPI: Theresa Duran is a 72 y.o. female with PMH significant for GERD who presents to [facility] today with epigastric abdominal pain of one months duration.   Language interpreter on the ipad is used to help obtain the HPI.  Abdominal pain -  she reports dull epigastric pain that has been present for several weeks. She has been taking bismuth, imodium, and cimetidine.  - Pain is located in epigastric region. It does not radiate. Nothing makes it worse. Pain is worst in the morning. She does have pain after eating. Pain was also noted in clinic visit from 06/13/2017. Pain had been going on for one month at that time.  - No fevers. Has had loose mucousy stools that were not watery over the past day.  Review of Symptoms:  See HPI for ROS.   CC, SH/smoking status, and VS noted.  Objective: BP 130/76   Pulse 74   Temp 98.2 F (36.8 C) (Oral)   Ht 4\' 9"  (1.448 m)   Wt 150 lb (68 kg)   SpO2 99%   BMI 32.46 kg/m  GEN: NAD, alert, cooperative, and pleasant. EYE: no conjunctival injection, pupils equally round and reactive to light RESPIRATORY: clear to auscultation bilaterally with no wheezes, rhonchi or rales, good effort CV: RRR, no m/r/g, no peripheral edema GI: soft, non-tender, non-distended, no hepatosplenomegaly SKIN: warm and dry, no rashes or lesions NEURO: II-XII grossly intact, normal gait, peripheral sensation intact PSYCH: AAOx3, appropriate affect  Assessment and plan:  1. Epigastric abdominal pain - epigastric pain worst after eating seems most consistent with reflux. We will increase her prilosec to 20 mg BID for the next 4-6 weeks and monitor for improvement in symptoms.  Her soft mucousy stools do not seem to be infectious in nature.  - follow up in 4-6 weeks if symptoms have not improved or resolved - if symptoms improve, can consider gradually tapering down prilosec  Meds ordered this encounter  Medications  . DISCONTD: omeprazole  (PRILOSEC) 20 MG capsule    Sig: Take 1 capsule (20 mg total) by mouth 2 (two) times daily before a meal.    Dispense:  60 capsule    Refill:  0     Everrett Coombe, MD,MS,  PGY3 01/12/2018 12:10 AM

## 2018-01-24 ENCOUNTER — Other Ambulatory Visit: Payer: Self-pay

## 2018-01-24 DIAGNOSIS — I10 Essential (primary) hypertension: Secondary | ICD-10-CM

## 2018-01-24 MED ORDER — HYDROCHLOROTHIAZIDE 12.5 MG PO TABS: 12.5000 mg | ORAL_TABLET | Freq: Every day | ORAL | 3 refills | Status: DC

## 2018-02-07 ENCOUNTER — Ambulatory Visit: Payer: Self-pay | Admitting: Family Medicine

## 2018-06-05 DIAGNOSIS — H442E3 Degenerative myopia with other maculopathy, bilateral eye: Secondary | ICD-10-CM | POA: Diagnosis not present

## 2018-06-05 DIAGNOSIS — H4423 Degenerative myopia, bilateral: Secondary | ICD-10-CM | POA: Diagnosis not present

## 2018-09-13 ENCOUNTER — Other Ambulatory Visit: Payer: Self-pay

## 2018-09-13 ENCOUNTER — Ambulatory Visit (INDEPENDENT_AMBULATORY_CARE_PROVIDER_SITE_OTHER): Payer: Medicare HMO | Admitting: Family Medicine

## 2018-09-13 VITALS — BP 122/54 | Temp 98.7°F

## 2018-09-13 DIAGNOSIS — E785 Hyperlipidemia, unspecified: Secondary | ICD-10-CM

## 2018-09-13 DIAGNOSIS — R42 Dizziness and giddiness: Secondary | ICD-10-CM | POA: Insufficient documentation

## 2018-09-13 LAB — GLUCOSE, POCT (MANUAL RESULT ENTRY): POC Glucose: 101 mg/dl — AB (ref 70–99)

## 2018-09-13 MED ORDER — ATORVASTATIN CALCIUM 10 MG PO TABS: 10.0000 mg | ORAL_TABLET | Freq: Every day | ORAL | 0 refills | Status: DC

## 2018-09-13 MED ORDER — MECLIZINE HCL 12.5 MG PO TABS: 12.5000 mg | ORAL_TABLET | Freq: Every day | ORAL | 0 refills | Status: DC | PRN

## 2018-09-13 NOTE — Assessment & Plan Note (Addendum)
Acute onset of "room spinning" sensation with positive Dix-Hallpike maneuver.  Likely vestibular in origin, such as BPPV.  Low concern for Mnire's/acoustic neuroma or Labyrinthitis given no associated tinnitus or hearing loss/abnormalities.  Normoglycemic during exam, 101. Considered additional dizziness differential including orthostatic hypotension, arrhythmias, bradycardia etc. given patient's age, however less likely given clinical history and cardiac exam wnl.  Patient has been compliant with consistent blood pressure on HCTZ without concern. - Ambulatory referral to physical therapy for vestibular rehab - Provided at home exercises for modified Epley maneuvers, walked through exercises with patient with the vietnamese interpreter - Provided low-dose meclizine to trial for an as-needed basis due to dizziness, weighed risks and benefits given on beers list, however would like to avoid falls in this older female with a history of osteoporosis - F/U if symptoms not improving or worsening

## 2018-09-13 NOTE — Progress Notes (Signed)
Subjective:    Patient ID: [name], female    DOB: 1945/10/23, 73 y.o.   MRN: [id number]   CC: dizziness for a few days   HPI: Ms. Lizama is a 73 year old female presenting to discuss the following:   Dizziness: Acute onset, episodes occurring for the past 2 days.  Sudden onset.  States it feels like the room is spinning and that she cannot move her eyes side to side quickly or else this makes it worse.  Will last a few minutes.  After the episode she would have to lay down and would take some Tylenol and eventually it would resolve.  She started noticing it while she was working in the kitchen moving around.  Other than fast side-to-side movements, she has not noted any exacerbating factors.  Has not experienced this before. Takes HCTZ daily for quite some time and has had stable blood pressure with this. No associated chest pain, palpitations, SOB, extremity weakness, slurring of speech, facial drooping. No tinnitus or hearing changes.   She additionally feels like her cholesterol medication is messing with her nails. Would like to change to a different statin.    Smoking status reviewed  Review of Systems Per HPI, also denies recent illness, fever, headache, changes in vision, chest pain, shortness of breath, abdominal pain, N/V/D, weakness   Patient Active Problem List   Diagnosis Date Noted  . Vertigo 09/13/2018  . Pain of upper abdomen 06/13/2017  . Pain in both feet 11/03/2016  . Leg swelling 11/03/2016  . Chest pain 01/05/2016  . Encounter for immunization 01/05/2016  . Osteoporosis 08/14/2015  . Cystocele 05/22/2015  . Urinary urgency 03/25/2015  . UTI (urinary tract infection) 02/01/2014  . Personal history of colonic polyps-adenoma 05/14/2013  . Essential hypertension, benign 01/14/2012  . Osteoarthritis of left knee 12/28/2011  . 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 Cough 11/09/2011  . Hyperlipidemia 08/18/2011  . Cataract 08/18/2011  . Low back pain 08/18/2011  .  Muscle cramps at night 08/18/2011     Objective:  BP (!) 122/54   Temp 98.7 F (37.1 C) HR 70. Vitals and nursing note reviewed  General: NAD, pleasant, older female, appears younger than stated age 25: MMM, bilateral external canals WNL, bilateral TMs clear with appropriate light reflex.  EOMI, PERRLA. Cardiac: Regular rate and rhythm, normal heart sounds Respiratory: CTAB, normal effort Abdomen: soft, nontender, nondistended Extremities: no edema or cyanosis.  Bilateral radial pulses strong and intact.  Fingernails with vertical ridges, slight yellowed appearance.  Skin: warm and dry, no rashes noted Neuro: alert and oriented, no focal deficits.  Gait normal. Psych: normal affect Dix-Hall Pike maneuver elicits dizziness bilaterally, no nystagmus appreciated.  Assessment & Plan:   Vertigo Acute onset of "room spinning" sensation with positive Dix-Hallpike maneuver.  Likely vestibular in origin, such as BPPV.  Low concern for Mnire's/acoustic neuroma or Labyrinthitis given no associated tinnitus or hearing loss/abnormalities.  Normoglycemic during exam, 101. Considered additional dizziness differential including orthostatic hypotension, arrhythmias, bradycardia etc. given patient's age, however less likely given clinical history and cardiac exam wnl.  Patient has been compliant with consistent blood pressure on HCTZ without concern. - Ambulatory referral to physical therapy for vestibular rehab - Provided at home exercises for modified Epley maneuvers, walked through exercises with patient with the vietnamese interpreter - Provided low-dose meclizine to trial for an as-needed basis due to dizziness, weighed risks and benefits given on beers list, however would like to avoid falls in this  older female with a history of osteoporosis - F/U if symptoms not improving or worsening   Transitioned to atorvastatin 10 mg from simvastatin 40 for primary prevention due to patient preference.   Instructed to stop taking the simvastatin.  Darrelyn Hillock, DO Family Medicine Resident PGY-1

## 2018-09-14 ENCOUNTER — Encounter: Payer: Self-pay | Admitting: Family Medicine

## 2018-10-02 ENCOUNTER — Telehealth: Payer: Self-pay | Admitting: *Deleted

## 2018-10-02 NOTE — Telephone Encounter (Signed)
Pt was switched from Simvastatin to atorvastatin.  Once she started taking this she was waking up with joint pain (having trouble walking) per daughter.    Daughter told her to stop medication immediately.   Will forward to MD for next step. Christen Bame, CMA

## 2018-10-04 ENCOUNTER — Other Ambulatory Visit: Payer: Self-pay | Admitting: Family Medicine

## 2018-10-04 ENCOUNTER — Other Ambulatory Visit: Payer: Self-pay

## 2018-10-04 DIAGNOSIS — E785 Hyperlipidemia, unspecified: Secondary | ICD-10-CM

## 2018-10-04 MED ORDER — ROSUVASTATIN CALCIUM 5 MG PO TABS: 5.0000 mg | ORAL_TABLET | Freq: Every day | ORAL | 0 refills | Status: DC

## 2018-10-04 NOTE — Telephone Encounter (Signed)
Please call the patient to let her know the changes below:   Sent in Crestor 5 mg, she may increase that to 10 mg in a few weeks.  Also recommend she try taking this medication at night to also help minimize side effects.  She should continue to not take the atorvastatin or simvastatin.  Lastly, if her pains do not improve with this transition of medication then she should be seen for follow-up to evaluate if alternative etiology.   Thank you,  Dr. Higinio Plan

## 2018-10-04 NOTE — Telephone Encounter (Signed)
Requesting a 90 day supply.

## 2018-10-04 NOTE — Telephone Encounter (Signed)
Patients daughter calls nurse line about below. I informed her Crestor has been called into the pharmacy and to stop taking simvastatin and atorvastatin. Patients daughter verbalized understanding.

## 2018-12-20 ENCOUNTER — Other Ambulatory Visit: Payer: Self-pay

## 2018-12-20 DIAGNOSIS — E785 Hyperlipidemia, unspecified: Secondary | ICD-10-CM

## 2018-12-20 MED ORDER — ATORVASTATIN CALCIUM 10 MG PO TABS: 10.0000 mg | ORAL_TABLET | Freq: Every day | ORAL | 0 refills | Status: DC

## 2019-01-01 ENCOUNTER — Other Ambulatory Visit: Payer: Self-pay | Admitting: Family Medicine

## 2019-01-01 DIAGNOSIS — E785 Hyperlipidemia, unspecified: Secondary | ICD-10-CM

## 2019-01-04 ENCOUNTER — Other Ambulatory Visit: Payer: Self-pay

## 2019-01-04 ENCOUNTER — Ambulatory Visit (INDEPENDENT_AMBULATORY_CARE_PROVIDER_SITE_OTHER): Payer: Medicare HMO | Admitting: Family Medicine

## 2019-01-04 VITALS — BP 130/80 | HR 93 | Wt 154.4 lb

## 2019-01-04 DIAGNOSIS — L603 Nail dystrophy: Secondary | ICD-10-CM | POA: Diagnosis not present

## 2019-01-04 DIAGNOSIS — R42 Dizziness and giddiness: Secondary | ICD-10-CM

## 2019-01-04 DIAGNOSIS — E785 Hyperlipidemia, unspecified: Secondary | ICD-10-CM

## 2019-01-04 NOTE — Patient Instructions (Addendum)
Great seeing you all today!  I believe that her vertigo-like symptoms are due to something called an otolith.  These can deposit within the inner ear and cause a lot of vertigo.  I am going to make a referral for neuro vestibular rehab.  They can move her head in several positions which will help alleviate the stones.  Please be on the look out for a call from them.  In regards to her statin management.  It looks like she has not supposed to be on Crestor 5 mg daily.  I would continue taking this medication.  The nail thinning is more likely to be due to her hydrochlorothiazide than her statin.  I would continue to watch it and if it continues to be bad then she should make an appointment with her primary care doctor to evaluate further.

## 2019-01-08 ENCOUNTER — Encounter: Payer: Self-pay | Admitting: Family Medicine

## 2019-01-08 DIAGNOSIS — L603 Nail dystrophy: Secondary | ICD-10-CM | POA: Insufficient documentation

## 2019-01-08 NOTE — Progress Notes (Signed)
  Patient's daughter in room for entirety of exam who acts as translator per patient's request  HPI 73 year old female who presents for dizziness.  She states that the dizziness started about 3 days ago and has been intermittent since then.  She describes that as the room is spinning and "unsteadiness on her feet".  She states that the dizziness is mainly when she is getting out of bed and when she is laying down.  She has not had any accompanying symptoms such as headache, fever, congestion, or any neurologic abnormality.  She has not had an upper respiratory infection recently.  Oddly the patient was seen on 09/13/2018 for the exact same issue.  She was felt to have an otolith and was referred to neuro rehab.  She is also given meclizine which patient states did not help her very much.  This dizziness eventually went away on its own but is very similar to the last time she was here.  Also of note patient has chronic thinning of her nails.  She has attributed to her statin use.  Patient was originally on Zocor but was switched to atorvastatin.  Patient had joint pain on the atorvastatin so we will switch to rosuvastatin.  She has been taking rosuvastatin 5 mg daily.  CC: Dizziness   ROS:   Review of Systems See HPI for ROS.   CC, SH/smoking status, and VS noted  Objective: BP 130/80   Pulse 93   Wt 154 lb 6.4 oz (70 kg)   SpO2 98%   BMI 33.41 kg/m  Gen: 73 year old Asian female, no acute distress, very pleasant HEENT: EOMI, PERRLA.  Almost no cerumen appreciated bilateral ear canal.  No bulging tympanic membrane.  No nystagmus noted on exam. CV: Regular rate and rhythm, no M/R/G Resp: Lungs clear to auscultation bilaterally, no accessory muscle use Neuro: CN II through XII intact, no focal neurologic abnormality appreciated, no gait abnormality Fingernails: Very thin, very brittle nails.  Multiple ridges seen within the nails.   Assessment and plan:  Vertigo Patient likely with  BPPV secondary to otolith.  Will make referral to neuro vestibular rehab for treatment.  Will not refill meclizine as this really did not help the patient very much.  Hyperlipidemia Patient originally on Zocor which was switched to atorvastatin.  Atorvastatin was switched to rosuvastatin secondary to joint pain.  All 3 medications were on patient's medication list.  Discontinued the Zocor and the atorvastatin.  Continue rosuvastatin 5 mg daily.  Could not find any literature supporting nail thinning while on statin.  Explained patient that I do not think a statin is causal for this.  Nail fragility Patient with very thin, brittle nails of unknown etiology.  On literature search hydrochlorothiazide can possibly be contributing.  Explained patient that I do not think a statin is contributing to this.  Recommended that she follow-up with her PCP for further evaluation and management of this issue.   Orders Placed This Encounter  Procedures  . Ambulatory Referral to Neuro Rehab    Referral Priority:   Routine    Referral Type:   Consultation    Number of Visits Requested:   1    No orders of the defined types were placed in this encounter.    Guadalupe Dawn MD PGY-3 Family Medicine Resident  01/08/2019 5:05 PM

## 2019-01-08 NOTE — Assessment & Plan Note (Signed)
Patient likely with BPPV secondary to otolith.  Will make referral to neuro vestibular rehab for treatment.  Will not refill meclizine as this really did not help the patient very much.

## 2019-01-08 NOTE — Assessment & Plan Note (Signed)
Patient with very thin, brittle nails of unknown etiology.  On literature search hydrochlorothiazide can possibly be contributing.  Explained patient that I do not think a statin is contributing to this.  Recommended that she follow-up with her PCP for further evaluation and management of this issue.

## 2019-01-08 NOTE — Assessment & Plan Note (Signed)
Patient originally on Zocor which was switched to atorvastatin.  Atorvastatin was switched to rosuvastatin secondary to joint pain.  All 3 medications were on patient's medication list.  Discontinued the Zocor and the atorvastatin.  Continue rosuvastatin 5 mg daily.  Could not find any literature supporting nail thinning while on statin.  Explained patient that I do not think a statin is causal for this.

## 2019-01-11 ENCOUNTER — Other Ambulatory Visit: Payer: Self-pay | Admitting: *Deleted

## 2019-01-11 DIAGNOSIS — E785 Hyperlipidemia, unspecified: Secondary | ICD-10-CM

## 2019-01-11 MED ORDER — ROSUVASTATIN CALCIUM 5 MG PO TABS: 5.0000 mg | ORAL_TABLET | Freq: Every day | ORAL | 0 refills | Status: DC

## 2019-01-23 ENCOUNTER — Other Ambulatory Visit: Payer: Self-pay

## 2019-01-23 ENCOUNTER — Ambulatory Visit (INDEPENDENT_AMBULATORY_CARE_PROVIDER_SITE_OTHER): Payer: Medicare HMO | Admitting: Family Medicine

## 2019-01-23 VITALS — BP 110/70 | HR 96 | Wt 150.6 lb

## 2019-01-23 DIAGNOSIS — Z114 Encounter for screening for human immunodeficiency virus [HIV]: Secondary | ICD-10-CM | POA: Diagnosis not present

## 2019-01-23 DIAGNOSIS — L603 Nail dystrophy: Secondary | ICD-10-CM | POA: Insufficient documentation

## 2019-01-23 DIAGNOSIS — L659 Nonscarring hair loss, unspecified: Secondary | ICD-10-CM | POA: Diagnosis not present

## 2019-01-23 DIAGNOSIS — I10 Essential (primary) hypertension: Secondary | ICD-10-CM | POA: Diagnosis not present

## 2019-01-23 NOTE — Assessment & Plan Note (Signed)
Plan per above.  Will check labs and follow up in 1 month.  Could be 2/2 advanced age and decreased estrogen.

## 2019-01-23 NOTE — Progress Notes (Signed)
     Subjective: Chief Complaint  Patient presents with  . Medication Management     HPI: Theresa Duran is a 73 y.o. presenting to clinic today to discuss the following:  1 Hair Loss and Brittle Nails Patient presents with complaint of generalized hair loss and brittle nails for many years.  Reports that she was told by a doctor previously that it may be due to her blood pressure medication and she would like to discuss this.  Notes that she has tried cuticle oil in the past without improvement.  Denies joint pain, no history of rashes.  No fevers.  No large changes in weight.  No excessive fatigue.   Daughter served as Veterinary surgeon for patient for entirety of encounter at patient's request.      ROS noted in HPI. Chief complaint noted.  Other Pertinent PMH: HTN, HLD Past Medical, Surgical, Social, and Family History Reviewed & Updated per EMR.      Social History   Tobacco Use  Smoking Status Never Smoker  Smokeless Tobacco Never Used   Smoking status noted.    Objective: BP 110/70   Pulse 96   Wt 150 lb 9.6 oz (68.3 kg)   SpO2 97%   BMI 32.59 kg/m  Vitals and nursing notes reviewed  Physical Exam:  General: 72 y.o. female in NAD HEENT: diffuse hair thinning without patches of hair loss noted Neck: No thyromegaly palpated Cardio: RRR no m/r/g Lungs: CTAB, no increased WOB Skin: warm and dry, dry nail beds with vertical ridges, onychorrhexis Extremities: No edema, Heberden's nodes bilateral hands   No results found for this or any previous visit (from the past 20 hour(s)).  Assessment/Plan:  Brittle nails HCTZ could cause patient's hair loss and brittle nails, although this is less likely the cause.  Given her blood pressures have been very well-controlled and she is on such a low dose (12.5mg ), will discontinue HCTZ.  Would also be more beneficial given her age to aim closer to goal of 130/80-140/90 to avoid hypotension.  Advised continued use of cuticle  oil.  Differential includes thyroid disorder, syphilis, iron deficiency anemia.  Will check BMP, CBC, TSH, ferritin, and RPR to rule out.  No evidence of psoriasis.  Could also consider fungal, although less likely as also associated with diffuse hair loss and affects all nails.  Screening for HIV (human immunodeficiency virus) Test for health maintenance screening.  Hair loss Plan per above.  Will check labs and follow up in 1 month.  Could be 2/2 advanced age and decreased estrogen.   Return in 1 month to discuss other health maintenance   PATIENT EDUCATION PROVIDED: See AVS    Diagnosis and plan along with any newly prescribed medication(s) were discussed in detail with this patient today. The patient verbalized understanding and agreed with the plan. Patient advised if symptoms worsen return to clinic or ER.    Orders Placed This Encounter  Procedures  . TSH  . RPR  . CBC  . Ferritin  . Basic Metabolic Panel  . HIV antibody (with reflex)    No orders of the defined types were placed in this encounter.    [place] Constable, DO 01/23/2019, 4:55 PM PGY-2 [place]

## 2019-01-23 NOTE — Assessment & Plan Note (Signed)
HCTZ could cause patient's hair loss and brittle nails, although this is less likely the cause.  Given her blood pressures have been very well-controlled and she is on such a low dose (12.5mg ), will discontinue HCTZ.  Would also be more beneficial given her age to aim closer to goal of 130/80-140/90 to avoid hypotension.  Advised continued use of cuticle oil.  Differential includes thyroid disorder, syphilis, iron deficiency anemia.  Will check BMP, CBC, TSH, ferritin, and RPR to rule out.  No evidence of psoriasis.  Could also consider fungal, although less likely as also associated with diffuse hair loss and affects all nails.

## 2019-01-23 NOTE — Assessment & Plan Note (Signed)
Test for health maintenance screening.

## 2019-01-23 NOTE — Patient Instructions (Signed)
Thank you for coming to see me today. It was a pleasure. Today we talked about:   Your hair and nails: Stop taking hydrochlorothiazide.  Monitor for signs of high blood pressure like headache, changes in vision, changes in balance.  Use cuticle oil.  We will check labs and I will call you if something is abnormal.  The number for the Rehab for her inner ear is [phone].  Call to schedule appointment.  Please follow-up with me in 1 month or sooner.  If you have any questions or concerns, please do not hesitate to call the office at [phone].  Best,   [place] Constable, DO

## 2019-01-25 ENCOUNTER — Telehealth: Payer: Self-pay | Admitting: Family Medicine

## 2019-01-25 DIAGNOSIS — E876 Hypokalemia: Secondary | ICD-10-CM

## 2019-01-25 MED ORDER — POTASSIUM CHLORIDE CRYS ER 20 MEQ PO TBCR: 40.0000 meq | EXTENDED_RELEASE_TABLET | Freq: Every day | ORAL | 0 refills | Status: DC

## 2019-01-25 NOTE — Telephone Encounter (Signed)
Left VM to call back.  K low at 2.9.  Patient told to discontinue HCTZ which will help, but will also send in Rx for K-Dur.  If patient calls back, please inform her that her blood work looks good so far except her potassium is low.  This shouldn't cause her nail changes, but we need to treat it.  I have sent a prescription to the pharmacy for Potasstium Chloride.  She should take two tablets today and then two tablets tomorrow.  I have given her extra in case we need to give her more after her levels are checked, but she should not take them more than two days.  She should come back on Monday for recheck of her potassium.  There is no need for a visit, I have placed a lab order so that she can just come in to have this checked.

## 2019-01-26 LAB — CBC
Hematocrit: 38.5 % (ref 34.0–46.6)
Hemoglobin: 13 g/dL (ref 11.1–15.9)
MCH: 31.5 pg (ref 26.6–33.0)
MCHC: 33.8 g/dL (ref 31.5–35.7)
MCV: 93 fL (ref 79–97)
Platelets: 246 10*3/uL (ref 150–450)
RBC: 4.13 x10E6/uL (ref 3.77–5.28)
RDW: 12.9 % (ref 11.7–15.4)
WBC: 5.7 10*3/uL (ref 3.4–10.8)

## 2019-01-26 LAB — BASIC METABOLIC PANEL
BUN/Creatinine Ratio: 21 (ref 12–28)
BUN: 20 mg/dL (ref 8–27)
CO2: 25 mmol/L (ref 20–29)
Calcium: 10.2 mg/dL (ref 8.7–10.3)
Chloride: 97 mmol/L (ref 96–106)
Creatinine, Ser: 0.97 mg/dL (ref 0.57–1.00)
GFR calc Af Amer: 67 mL/min/{1.73_m2} (ref >59)
GFR calc non Af Amer: 59 mL/min/{1.73_m2} — ABNORMAL LOW (ref >59)
Glucose: 117 mg/dL — ABNORMAL HIGH (ref 65–99)
Potassium: 2.9 mmol/L — ABNORMAL LOW (ref 3.5–5.2)
Sodium: 141 mmol/L (ref 134–144)

## 2019-01-26 LAB — HIV 1/2 AB DIFFERENTIATION
HIV 1 Ab: NEGATIVE
HIV 2 Ab: NEGATIVE
NOTE (HIV CONF MULTIP: NEGATIVE

## 2019-01-26 LAB — FERRITIN: Ferritin: 134 ng/mL (ref 15–150)

## 2019-01-26 LAB — TSH: TSH: 2.83 u[IU]/mL (ref 0.450–4.500)

## 2019-01-26 LAB — RNA QUALITATIVE: HIV 1 RNA Qualitative: NEGATIVE

## 2019-01-26 LAB — RPR: RPR Ser Ql: NONREACTIVE

## 2019-01-26 LAB — HIV ANTIBODY (ROUTINE TESTING W REFLEX): HIV Screen 4th Generation wRfx: REACTIVE — AB

## 2019-01-29 ENCOUNTER — Other Ambulatory Visit: Payer: Self-pay

## 2019-01-29 ENCOUNTER — Other Ambulatory Visit: Payer: Medicare HMO

## 2019-01-29 DIAGNOSIS — E876 Hypokalemia: Secondary | ICD-10-CM | POA: Diagnosis not present

## 2019-01-30 LAB — BASIC METABOLIC PANEL
BUN/Creatinine Ratio: 20 (ref 12–28)
BUN: 15 mg/dL (ref 8–27)
CO2: 25 mmol/L (ref 20–29)
Calcium: 9.4 mg/dL (ref 8.7–10.3)
Chloride: 107 mmol/L — ABNORMAL HIGH (ref 96–106)
Creatinine, Ser: 0.76 mg/dL (ref 0.57–1.00)
GFR calc Af Amer: 91 mL/min/{1.73_m2} (ref >59)
GFR calc non Af Amer: 79 mL/min/{1.73_m2} (ref >59)
Glucose: 80 mg/dL (ref 65–99)
Potassium: 4.8 mmol/L (ref 3.5–5.2)
Sodium: 144 mmol/L (ref 134–144)

## 2019-02-01 ENCOUNTER — Telehealth: Payer: Self-pay

## 2019-02-01 NOTE — Telephone Encounter (Signed)
Pt informed using [place] interpreters [phone]). Ottis Stain, CMA

## 2019-02-01 NOTE — Telephone Encounter (Signed)
-----   Message from Cleophas Dunker, DO sent at 01/30/2019 12:35 PM EDT ----- Please call back and inform patient that her potassium is now normal and to not take anymore potassium

## 2019-02-26 ENCOUNTER — Other Ambulatory Visit: Payer: Self-pay

## 2019-02-26 DIAGNOSIS — E785 Hyperlipidemia, unspecified: Secondary | ICD-10-CM

## 2019-02-26 NOTE — Telephone Encounter (Signed)
Fax came from [employer]. Ottis Stain, CMA

## 2019-02-27 DIAGNOSIS — R69 Illness, unspecified: Secondary | ICD-10-CM | POA: Diagnosis not present

## 2019-02-27 MED ORDER — ROSUVASTATIN CALCIUM 5 MG PO TABS: 5.0000 mg | ORAL_TABLET | Freq: Every day | ORAL | 0 refills | Status: DC

## 2019-03-06 MED ORDER — ROSUVASTATIN CALCIUM 5 MG PO TABS: 5.0000 mg | ORAL_TABLET | Freq: Every day | ORAL | 0 refills | Status: DC

## 2019-03-06 NOTE — Addendum Note (Signed)
Addended by: Valerie Roys on: 03/06/2019 09:27 AM   Modules accepted: Orders

## 2019-03-06 NOTE — Telephone Encounter (Signed)
Request from [facility] for this medication.  Resent in a 90 day supply for better medication adherence and cost.  Aidyn Sportsman,CMA

## 2019-04-20 ENCOUNTER — Other Ambulatory Visit: Payer: Self-pay

## 2019-06-11 DIAGNOSIS — H35372 Puckering of macula, left eye: Secondary | ICD-10-CM | POA: Diagnosis not present

## 2019-06-11 DIAGNOSIS — H35352 Cystoid macular degeneration, left eye: Secondary | ICD-10-CM | POA: Diagnosis not present

## 2019-06-11 DIAGNOSIS — H442D3 Degenerative myopia with foveoschisis, bilateral eye: Secondary | ICD-10-CM | POA: Diagnosis not present

## 2019-06-12 ENCOUNTER — Other Ambulatory Visit: Payer: Self-pay

## 2019-06-12 DIAGNOSIS — E785 Hyperlipidemia, unspecified: Secondary | ICD-10-CM

## 2019-06-12 MED ORDER — ROSUVASTATIN CALCIUM 5 MG PO TABS: 5.0000 mg | ORAL_TABLET | Freq: Every day | ORAL | 0 refills | Status: DC

## 2019-07-24 ENCOUNTER — Other Ambulatory Visit: Payer: Self-pay | Admitting: Family Medicine

## 2019-07-24 DIAGNOSIS — Z1231 Encounter for screening mammogram for malignant neoplasm of breast: Secondary | ICD-10-CM

## 2019-08-29 ENCOUNTER — Other Ambulatory Visit: Payer: Self-pay

## 2019-08-29 ENCOUNTER — Ambulatory Visit
Admission: RE | Admit: 2019-08-29 | Discharge: 2019-08-29 | Disposition: A | Discharge status: Home / Self Care | Payer: Medicare HMO | Source: Ambulatory Visit | Attending: Family Medicine | Admitting: Family Medicine

## 2019-08-29 DIAGNOSIS — Z1231 Encounter for screening mammogram for malignant neoplasm of breast: Secondary | ICD-10-CM | POA: Diagnosis not present

## 2019-08-31 ENCOUNTER — Other Ambulatory Visit: Payer: Self-pay | Admitting: Family Medicine

## 2019-08-31 DIAGNOSIS — R928 Other abnormal and inconclusive findings on diagnostic imaging of breast: Secondary | ICD-10-CM

## 2019-09-03 ENCOUNTER — Other Ambulatory Visit: Payer: Self-pay

## 2019-09-03 ENCOUNTER — Encounter: Payer: Self-pay | Admitting: Family Medicine

## 2019-09-03 ENCOUNTER — Ambulatory Visit (INDEPENDENT_AMBULATORY_CARE_PROVIDER_SITE_OTHER): Payer: Medicare HMO | Admitting: Family Medicine

## 2019-09-03 ENCOUNTER — Other Ambulatory Visit: Payer: Self-pay | Admitting: Family Medicine

## 2019-09-03 VITALS — BP 128/72 | HR 75 | Ht <= 58 in | Wt 151.6 lb

## 2019-09-03 DIAGNOSIS — E785 Hyperlipidemia, unspecified: Secondary | ICD-10-CM

## 2019-09-03 DIAGNOSIS — M810 Age-related osteoporosis without current pathological fracture: Secondary | ICD-10-CM | POA: Diagnosis not present

## 2019-09-03 DIAGNOSIS — Z1211 Encounter for screening for malignant neoplasm of colon: Secondary | ICD-10-CM | POA: Insufficient documentation

## 2019-09-03 DIAGNOSIS — G8929 Other chronic pain: Secondary | ICD-10-CM | POA: Diagnosis not present

## 2019-09-03 DIAGNOSIS — M25561 Pain in right knee: Secondary | ICD-10-CM | POA: Diagnosis not present

## 2019-09-03 DIAGNOSIS — M25562 Pain in left knee: Secondary | ICD-10-CM | POA: Diagnosis not present

## 2019-09-03 DIAGNOSIS — I10 Essential (primary) hypertension: Secondary | ICD-10-CM | POA: Diagnosis not present

## 2019-09-03 NOTE — Assessment & Plan Note (Signed)
Last colonoscopy in 2015 with 2 sessile polyps.  Was advised to repeat in 5 years.  She has not yet had this done.  Will order colonoscopy today.  Advised patient's daughter to call to schedule this appointment.  Given colonoscopy form.

## 2019-09-03 NOTE — Progress Notes (Signed)
    SUBJECTIVE:   CHIEF COMPLAINT / HPI:   HTN On problem list, not currently on any medications Denies chest pain, shortness of breath, leg swelling  HLD Last lipid panel August 2018, at that time LDL 69 Currently on Crestor 5 mg Tolerating well  Osteoporosis Patient used to take Fosamax once weekly, but has not in quite some time Not on omeprazole Last DEXA scan in 2017 with left femoral neck T score of -2.7 No recent fractures  Colon Polyps Patient due for colonoscopy in 2020 Last colonoscopy in 2015 with two polyps  Bilateral Knee Pain  Patient has chronic bilateral knee pain Stiff in the mornings Improves with activity at first, then worse in the evenings when she is more active  Patient declines interpreter, daughter acts as interpreter during examination at her request  PERTINENT  PMH / PSH: Hypertension, osteoporosis, hyperlipidemia  OBJECTIVE:   BP 128/72   Pulse 75   Ht 4\' 9"  (1.448 m)   Wt 151 lb 9.6 oz (68.8 kg)   SpO2 99%   BMI 32.81 kg/m    Physical Exam:  General: 74 y.o. female in NAD Cardio: RRR no m/r/g Lungs: CTAB, no wheezing, no rhonchi, no crackles, no IWOB on RA Skin: warm and dry Extremities: No edema, bilateral hands with enlargement of DIP and PIPs   Bilateral Knees: - Inspection: no gross deformity b/l. No swelling/effusion, erythema or bruising b/l. Skin intact - Palpation: no TTP b/l - ROM: full active ROM with flexion and extension in knee and hip, crepitus with ROM b/l - Strength: 5/5 strength b/l - Neuro/vasc: NV intact - Special Tests: - LIGAMENTS: negative anterior and posterior drawer, negative Lachman's, no MCL or LCL laxity  -- MENISCUS: negative McMurray's, negative Thessaly  -- PF JOINT:+ clark's sign on left, negative on right  Bilateral Hips: normal ROM, negative FABER and FADIR bilaterally    ASSESSMENT/PLAN:   Hyperlipidemia Last lipid panel 2018, currently on Crestor 5 mg and tolerating well.  Continue  with Crestor and will check lipid panel today.  Essential hypertension, benign Not currently on any medication.  Currently well controlled.  Today she is at goal.  No changes at this time.  Osteoporosis Patient has a history of osteoporosis.  She is not currently taking Fosamax.  Was due for DEXA scan in 2019, will order today.  We will use this to assess need for Fosamax.  Screen for colon cancer Last colonoscopy in 2015 with 2 sessile polyps.  Was advised to repeat in 5 years.  She has not yet had this done.  Will order colonoscopy today.  Advised patient's daughter to call to schedule this appointment.  Given colonoscopy form.  Chronic pain of both knees Most likely secondary to osteoarthritis.  Also has some patellofemoral pain in the left knee.  Discussed with patient's daughter that she can use Voltaren gel over-the-counter for any pain.  Can also use Tylenol or ibuprofen sparingly as needed.  Encourage water aerobics.  Also referral to physical therapy.  Return in 2 months or sooner as needed to follow-up on pain.     Cleophas Dunker, [place]

## 2019-09-03 NOTE — Patient Instructions (Signed)
Thank you for coming to see me today. It was a pleasure. Today we talked about:   Try Voltaren gel for your knees.  You can also try water aerobics.    I have placed a referral to Physical Therapy for your knee pain.  If you do not hear from them in the next 2 weeks, please give [place] a call.  You are due for a colonoscopy.  Please use the form that we have given you to schedule this at your convenience.   Please follow-up with me in 2 months or sooner.    If you have any questions or concerns, please do not hesitate to call the office at [phone].  Best,   [place] Constable, DO

## 2019-09-03 NOTE — Assessment & Plan Note (Signed)
Not currently on any medication.  Currently well controlled.  Today she is at goal.  No changes at this time.

## 2019-09-03 NOTE — Assessment & Plan Note (Signed)
Patient has a history of osteoporosis.  She is not currently taking Fosamax.  Was due for DEXA scan in 2019, will order today.  We will use this to assess need for Fosamax.

## 2019-09-03 NOTE — Assessment & Plan Note (Signed)
Last lipid panel 2018, currently on Crestor 5 mg and tolerating well.  Continue with Crestor and will check lipid panel today.

## 2019-09-03 NOTE — Assessment & Plan Note (Signed)
Most likely secondary to osteoarthritis.  Also has some patellofemoral pain in the left knee.  Discussed with patient's daughter that she can use Voltaren gel over-the-counter for any pain.  Can also use Tylenol or ibuprofen sparingly as needed.  Encourage water aerobics.  Also referral to physical therapy.  Return in 2 months or sooner as needed to follow-up on pain.

## 2019-09-04 LAB — LIPID PANEL
Chol/HDL Ratio: 3 ratio (ref 0.0–4.4)
Cholesterol, Total: 163 mg/dL (ref 100–199)
HDL: 55 mg/dL (ref >39)
LDL Chol Calc (NIH): 83 mg/dL (ref 0–99)
Triglycerides: 143 mg/dL (ref 0–149)
VLDL Cholesterol Cal: 25 mg/dL (ref 5–40)

## 2019-09-06 ENCOUNTER — Ambulatory Visit
Admission: RE | Admit: 2019-09-06 | Discharge: 2019-09-06 | Disposition: A | Discharge status: Home / Self Care | Payer: Medicare HMO | Source: Ambulatory Visit | Attending: Family Medicine | Admitting: Family Medicine

## 2019-09-06 ENCOUNTER — Other Ambulatory Visit: Payer: Self-pay

## 2019-09-06 ENCOUNTER — Encounter: Payer: Self-pay | Admitting: Family Medicine

## 2019-09-06 DIAGNOSIS — Z780 Asymptomatic menopausal state: Secondary | ICD-10-CM | POA: Diagnosis not present

## 2019-09-06 DIAGNOSIS — M810 Age-related osteoporosis without current pathological fracture: Secondary | ICD-10-CM | POA: Diagnosis not present

## 2019-09-06 NOTE — Progress Notes (Signed)
Letter sent.

## 2019-09-11 ENCOUNTER — Other Ambulatory Visit: Payer: Self-pay

## 2019-09-11 ENCOUNTER — Ambulatory Visit
Admission: RE | Admit: 2019-09-11 | Discharge: 2019-09-11 | Disposition: A | Discharge status: Home / Self Care | Payer: Medicare HMO | Source: Ambulatory Visit | Attending: Family Medicine | Admitting: Family Medicine

## 2019-09-11 DIAGNOSIS — R928 Other abnormal and inconclusive findings on diagnostic imaging of breast: Secondary | ICD-10-CM

## 2019-09-11 DIAGNOSIS — N6321 Unspecified lump in the left breast, upper outer quadrant: Secondary | ICD-10-CM | POA: Diagnosis not present

## 2019-09-12 ENCOUNTER — Ambulatory Visit: Payer: Medicare HMO

## 2019-09-13 ENCOUNTER — Other Ambulatory Visit: Payer: Self-pay | Admitting: Family Medicine

## 2019-09-13 DIAGNOSIS — M810 Age-related osteoporosis without current pathological fracture: Secondary | ICD-10-CM

## 2019-09-13 MED ORDER — ALENDRONATE SODIUM 70 MG PO TABS: 70.0000 mg | ORAL_TABLET | ORAL | 11 refills | Status: DC

## 2019-09-13 NOTE — Progress Notes (Signed)
rx for fosamax

## 2019-09-17 ENCOUNTER — Other Ambulatory Visit: Payer: Self-pay

## 2019-09-17 ENCOUNTER — Ambulatory Visit: Payer: Medicare HMO | Attending: Family Medicine | Admitting: Physical Therapy

## 2019-09-17 ENCOUNTER — Encounter: Payer: Self-pay | Admitting: Physical Therapy

## 2019-09-17 ENCOUNTER — Ambulatory Visit (INDEPENDENT_AMBULATORY_CARE_PROVIDER_SITE_OTHER): Payer: Medicare HMO | Admitting: Family Medicine

## 2019-09-17 VITALS — BP 149/71

## 2019-09-17 VITALS — BP 141/73 | HR 88

## 2019-09-17 DIAGNOSIS — M25662 Stiffness of left knee, not elsewhere classified: Secondary | ICD-10-CM | POA: Insufficient documentation

## 2019-09-17 DIAGNOSIS — G8929 Other chronic pain: Secondary | ICD-10-CM | POA: Insufficient documentation

## 2019-09-17 DIAGNOSIS — M6281 Muscle weakness (generalized): Secondary | ICD-10-CM | POA: Diagnosis not present

## 2019-09-17 DIAGNOSIS — H81399 Other peripheral vertigo, unspecified ear: Secondary | ICD-10-CM

## 2019-09-17 DIAGNOSIS — R42 Dizziness and giddiness: Secondary | ICD-10-CM | POA: Diagnosis not present

## 2019-09-17 DIAGNOSIS — M25562 Pain in left knee: Secondary | ICD-10-CM | POA: Diagnosis not present

## 2019-09-17 DIAGNOSIS — M25561 Pain in right knee: Secondary | ICD-10-CM | POA: Insufficient documentation

## 2019-09-17 DIAGNOSIS — R2681 Unsteadiness on feet: Secondary | ICD-10-CM | POA: Diagnosis not present

## 2019-09-17 DIAGNOSIS — R262 Difficulty in walking, not elsewhere classified: Secondary | ICD-10-CM | POA: Insufficient documentation

## 2019-09-17 MED ORDER — MECLIZINE HCL 25 MG PO TABS: 25.0000 mg | ORAL_TABLET | Freq: Three times a day (TID) | ORAL | 0 refills | Status: DC | PRN

## 2019-09-17 NOTE — Patient Instructions (Signed)
Access Code: [email or web address] URL: [email or web address] Date: 09/17/2019 Prepared by: Estill Bamberg April Thurnell Garbe  Exercises Supine Quad Set - 1 x daily - 7 x weekly - 10 reps - 3 sets Supine Heel Slide - 1 x daily - 7 x weekly - 10 reps - 3 sets Supine Piriformis Stretch with Foot on Ground - 1 x daily - 7 x weekly - 3 sets - 20 sec hold

## 2019-09-17 NOTE — Patient Instructions (Signed)
Chng m?t Vertigo Chng m?t l m?t c?m gic qu v? ho?c nh?ng v?t xung quanh qu v? chuy?n ??ng trong khi th?c t? khng ph?i nh? v?y. C?m gic ny c th? ??n v ?i b?t c? lc no. Chng m?t th??ng t? kh?i. Chng m?t c th? nguy hi?m n?u n x?y ra khi qu v? ?ang lm g ? c th? gy nguy hi?m cho b?n thn ho?c cho ng??i khc, ch?ng h?n nh? li xe ho?c v?n hnh my mc h?ng n?ng. Chuyn gia ch?m Theresa Duran s?c kh?e s? lm cc xt nghi?m ?? xc ??nh nguyn nhn gy chng m?t c?a qu v?. Cc xt nghi?m c?ng s? gip chuyn gia ch?m Theresa Duran s?c kh?e quy?t ??nh cch t?t nh?t ?? ?i?u tr? tnh tr?ng c?a qu v?. Tun th? nh?ng h??ng d?n ny ? nh: ?n v u?ng      U?ng ?? n??c ?? gi? cho n??c ti?u c mu vng nh?t.  Khng u?ng r??u. Ho?t ??ng  Tr? l?i sinh ho?t bnh th??ng theo ch? d?n c?a chuyn gia ch?m Theresa Duran s?c kh?e. Hy h?i chuyn gia ch?m Theresa Duran s?c kh?e v? cc ho?t ??ng no l an ton cho qu v?.  Vo bu?i sng, ??u tin l ng?i d?y trn mp gi??ng. Khi qu v? c?m th?y ?n, hy t? t? ??ng d?y trong lc bm vo v?t g ? cho ??n khi qu v? th?y th?ng b?ng.  C? ??ng ch?m. Theresa Duran c? ??ng c? th? v ??u ??t ng?t ho?c m?t s? t? th? nh?t ??nh theo nh? h??ng d?n c?a chuyn gia ch?m Theresa Duran s?c kh?e.  N?u qu v? kh ?i l?i ho?c kh gi? th?ng b?ng, th? dng m?t cy g?y ?? cn b?ng. N?u qu v? c?m th?y chng m?t ho?c khng cn b?ng, hy ng?i xu?ng ngay.  Theresa Duran lm b?t k? vi?c g gy nguy hi?m cho qu v? ho?c ng??i khc n?u chng m?t x?y ra.  Trnh g?p ng??i xu?ng n?u qu v? c?m th?y chng m?t. ??t cc ?? v?t trong nh sao cho d? v?i t?i m khng c?n ph?i nhoi ng??i.  Khng li xe ho?c v?n hnh my mc n?ng n?u qu v? c?m th?y chng m?t. H??ng d?n chung  Ch? s? d?ng thu?c khng k ??n v thu?c k ??n theo ch? d?n c?a chuyn gia ch?m Theresa Duran s?c kh?e.  Tun th? t?t c? cc l?n khm theo di theo ch? d?n c?a chuyn gia ch?m Theresa Duran s?c kh?e. ?i?u ny c vai tr quan tr?ng. Hy lin l?c v?i chuyn gia ch?m Theresa Duran s?c kh?e  n?u:  Thu?c khng lm gi?m tnh tr?ng chng m?c ho?c lm tnh tr?ng tr?m tr?ng h?n.  Qu v? b? s?t.  Tnh tr?ng c?a qu v? tr?m tr?ng h?n ho?c qu v? c nh?ng tri?u ch?ng m?i.  Gia ?nh v b?n b qu v? nh?n th?y qu v? c nh?ng thay ??i v? hnh vi.  Tnh tr?ng bu?n nn v nn m?a tr?m tr?ng h?n.  Qu v? b? t b ho?c c?m gic r?n r?n nh? ki?n b ho?c ng?a ran ? m?t b? ph?n c? th?. Yu c?u tr? gip ngay l?p t?c n?u qu v?:  B? kh c? ??ng ho?c kh ni.  Lun b? chng m?t.  Ng?t x?u.  B? ?au ??u r?t nhi?u.  B? y?u ? bn tay, cnh tay ho?c chn.  B? thay ??i thnh l?c ho?c th? l?c.  B? c?ng c?.  B? nh?y c?m v?i nh sng. Tm t?t  Chng m?t l m?t c?m gic  qu v? ho?c nh?ng v?t xung quanh qu v? chuy?n ??ng trong khi th?c t? khng ph?i nh? v?y.  Chuyn gia ch?m Theresa Duran s?c kh?e s? lm cc xt nghi?m ?? xc ??nh nguyn nhn gy chng m?t c?a qu v?.  Tun th? cc h??ng d?n ch?m Theresa Duran t?i nh. Qu v? c th? ???c yu c?u trnh nh?ng cng vi?c, t? th? ho?c c? ??ng nh?t ??nh.  Lin l?c v?i chuyn gia ch?m Theresa Duran s?c kh?e n?u thu?c c?a qu v? khng lm gi?m nh? cc tri?u ch?ng, ho?c n?u qu v? b? s?t, bu?n nn, nn ho?c thay ??i hnh vi.  Tm s? tr? gip ngay l?p t?c n?u qu v? b? ?au ??u d? d?i ho?c kh ni, ho?c qu v? b? cc v?n ?? v? thnh l?c ho?c th? l?c. Thng tin ny khng nh?m m?c ?ch thay th? cho l?i khuyn m chuyn gia ch?m Theresa Duran s?c kh?e ni v?i qu v?. Hy b?o ??m qu v? ph?i th?o lu?n b?t k? v?n ?? g m qu v? c v?i chuyn gia ch?m Theresa s?c kh?e c?a qu v?. Document Revised: 03/22/2018 Document Reviewed: 03/22/2018 Elsevier Patient Education  2020 [employer].

## 2019-09-17 NOTE — Therapy (Signed)
[place], [place], [address] Phone: [phone]   Fax:  [phone]  Physical Therapy Evaluation  Patient Details  Name: Theresa Duran MRN: [id number] Date of Birth: 1945-06-11 Referring Provider (PT): Kinnie Feil, MD   Encounter Date: 09/17/2019  PT End of Session - 09/17/19 1403    Visit Number  1    Number of Visits  16    Date for PT Re-Evaluation  11/12/19    Authorization Type  [employer] MCR/MCD    PT Start Time  9211    PT Stop Time  1445    PT Time Calculation (min)  40 min    Activity Tolerance  Treatment limited secondary to medical complications (Comment)   PT stopped session due to pt c/o increased dizziness and nausea   Behavior During Therapy  [facility] for tasks assessed/performed       Past Medical History:  Diagnosis Date  . Hyperlipidemia   . Personal history of colonic polyps-adenoma 05/14/2013    Past Surgical History:  Procedure Laterality Date  . ABDOMINAL HYSTERECTOMY     total vaginal with BSO, secondary to prolapse    Vitals:   09/17/19 1410 09/17/19 1450  BP: (!) 152/82 (!) 149/71     Subjective Assessment - 09/17/19 1406    Subjective  Pt reports she has high blood pressure and gets dizziness at times -- pt reports she is following up with her PCP after this evaluation due to recent change in her BP medication. Pt reports bilateral knee pain. Pt states she has pain and no full ROM on the Lt knee; however, R has pain with achy/numbness (mostly in knee cap). No numbness on L but all day pain (not constant); has been unable to bend knee for a full year.    Patient is accompained by:  Interpreter    Limitations  Standing;House hold activities    How long can you stand comfortably?  30 to 40 minutes    Patient Stated Goals  Improve pain and knee ROM    Currently in Pain?  Yes    Pain Score  7     Pain Location  Knee    Pain Orientation  Left    Pain Type  Chronic pain    Pain Onset  More than a month ago    Pain Frequency  Intermittent    Aggravating Factors   Getting up and sitting down, prolonged walking    Pain Relieving Factors  massage    Multiple Pain Sites  Yes    Pain Score  4    Pain Location  Knee    Pain Orientation  Right    Pain Descriptors / Indicators  Aching;Numbness    Pain Type  Chronic pain    Pain Frequency  Intermittent         [facility] PT Assessment - 09/17/19 0001      Assessment   Medical Diagnosis  M25.561,M25.562,G89.29 (ICD-10-CM) - Chronic pain of both knees    Referring Provider (PT)  Kinnie Feil, MD    Next MD Visit  09/17/19    Prior Therapy  None      [place]  Private residence    Living Arrangements  Children      Prior Function   Level of Independence  Independent      Observation/Other Assessments   Focus on Therapeutic Outcomes ([place])   53  AROM   Right Knee Extension  0    Right Knee Flexion  120    Left Knee Extension  -10    Left Knee Flexion  105      Strength   Right Hip Flexion  4-/5    Right Hip ABduction  3+/5    Left Hip Flexion  3+/5    Left Hip ABduction  3+/5    Right Knee Flexion  4/5    Right Knee Extension  4/5    Left Knee Flexion  4-/5    Left Knee Extension  4-/5      Flexibility   Hamstrings  110 on R, 120 on L      Palpation   Palpation comment  Hypomobile L knee cap especially prox/distal      Ambulation/Gait   Ambulation/Gait  Yes    Ambulation/Gait Assistance  4: Min guard    Ambulation Distance (Feet)  50 Feet    Assistive device  None    Gait Pattern  Step-to pattern;Decreased stride length;Decreased stance time - left;Decreased weight shift to left;Antalgic;Left flexed knee in stance    Ambulation Surface  Level;Indoor                  Objective measurements completed on examination: See above findings.      [facility] Adult PT Treatment/Exercise - 09/17/19 0001      Exercises   Exercises  Knee/Hip       Knee/Hip Exercises: Stretches   Active Hamstring Stretch  30 seconds;Both    Piriformis Stretch  Left;30 seconds      Knee/Hip Exercises: Supine   Quad Sets  Strengthening;Both;10 reps    Heel Slides  AROM;10 reps;Left      Manual Therapy   Manual Therapy  Joint mobilization    Joint Mobilization  Patellar mobs medial/lateral and prox/distal x10 reps             PT Education - 09/17/19 1525    Education Details  Discussed with pt at the beginning of treatment to report any change in her status and that PT will be monitoring her BP response during evaluation. Discussed strengthening and stretching to improve knee ROM    Person(s) Educated  Patient    Methods  Explanation;Demonstration;Handout;Verbal cues;Tactile cues    Comprehension  Returned demonstration;Verbal cues required;Tactile cues required;Other (comment)   Needs interpreter      PT Short Term Goals - 09/17/19 1548      PT SHORT TERM GOAL #1   Title  Pt will report reduction of bilateral knee pain by 2 points    Baseline  L knee 7/10, R knee 4/10 pain    Time  4    Period  Weeks    Status  New    Target Date  10/15/19        PT Long Term Goals - 09/17/19 1550      PT LONG TERM GOAL #1   Title  Pt will be independent with HEP    Time  8    Period  Weeks    Status  New    Target Date  11/12/19      PT LONG TERM GOAL #2   Title  Pt will have improved L knee ROM to 0 to 120 degrees    Baseline  L knee 10 to 105 degree AROM    Time  8    Period  Weeks    Status  New  Target Date  11/12/19      PT LONG TERM GOAL #3   Title  Pt will increase knee and hip strength to 5/5 for improved function    Time  8    Period  Weeks    Status  New    Target Date  11/12/19      PT LONG TERM GOAL #4   Title  Pt will demonstrate normal reciprocal gait pattern    Baseline  Antalgic gait    Time  8    Period  Weeks    Status  New    Target Date  11/12/19      PT LONG TERM GOAL #5   Title  Pt will have  improved FOTO score to </= 38%    Baseline  53% impairment on FOTO    Time  8    Period  Weeks    Status  New    Target Date  11/12/19             Plan - 09/17/19 1513    Clinical Impression Statement  Pt is a 74 y/o F presenting to OPPT with complaint of bilateral knee pain (L worse than R). Session limited due to pt with elevated BP; pt to see PCP after evaluation due to recent change in BP medication. PT monitored BP throughout evaluation, BP initially 152/82 but decreased with rest and never raised above this number. However, stopped eval with BP of 147/71 due to dizziness and nausea. Pt found to have decreased knee ROM, decreased patellar mobility, decreased hip strength and tightness affecting gait and home activity. Pt would benefit from therapy to address these issues.    Personal Factors and Comorbidities  Age;Comorbidity 1    Comorbidities  HTN    Examination-Activity Limitations  Stand;Locomotion Level;Caring for Others    Examination-Participation Restrictions  Yard Work;Community Activity;Cleaning    Clinical Decision Making  Low    Rehab Potential  Good    PT Frequency  2x / week    PT Duration  8 weeks    PT Treatment/Interventions  ADLs/Self Care Home Management;Biofeedback;Electrical Stimulation;Moist Heat;Cryotherapy;Iontophoresis 4mg /ml Dexamethasone;Ultrasound;Gait training;Stair training;Functional mobility training;Therapeutic activities;Therapeutic exercise;Balance training;Neuromuscular re-education;Patient/family education;Manual techniques;Passive range of motion;Dry needling;Vasopneumatic Device;Taping    PT Next Visit Plan  Continue to improve knee/hip strengthening and ROM. Patellar mobs if needed. Continue quad strengthening. Monitor BP if needed    PT Home Exercise Plan  Access Code: [email or web address]    Consulted and Agree with Plan of Care  Patient       Patient will benefit from skilled therapeutic intervention in order to improve the following deficits and  impairments:  Abnormal gait, Decreased range of motion, Difficulty walking, Decreased activity tolerance, Pain, Decreased balance, Hypomobility, Impaired flexibility, Decreased mobility, Decreased strength  Visit Diagnosis: Chronic pain of left knee  Chronic pain of right knee  Stiffness of left knee, not elsewhere classified  Muscle weakness (generalized)     Problem List Patient Active Problem List   Diagnosis Date Noted  . Screen for colon cancer 09/03/2019  . Chronic pain of both knees 09/03/2019  . Brittle nails 01/23/2019  . Hair loss 01/23/2019  . Screening for HIV (human immunodeficiency virus) 01/23/2019  . Nail fragility 01/08/2019  . Vertigo 09/13/2018  . Pain of upper abdomen 06/13/2017  . Pain in both feet 11/03/2016  . Leg swelling 11/03/2016  . Chest pain 01/05/2016  . Encounter for immunization 01/05/2016  . Osteoporosis 08/14/2015  . Cystocele 05/22/2015  .  Urinary urgency 03/25/2015  . UTI (urinary tract infection) 02/01/2014  . Personal history of colonic polyps-adenoma 05/14/2013  . Essential hypertension, benign 01/14/2012  . Osteoarthritis of left knee 12/28/2011  . 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 Cough 11/09/2011  . Hyperlipidemia 08/18/2011  . Cataract 08/18/2011  . Low back pain 08/18/2011  . Muscle cramps at night 08/18/2011    [facility] April Ma L [place] PT, DPT 09/17/2019, 3:55 PM  [facility] [address] [place], [place], [address] Phone: [phone]   Fax:  [phone]  Name: Joyce Heitman MRN: [id number] Date of Birth: 31-Aug-1945

## 2019-09-17 NOTE — Progress Notes (Signed)
    SUBJECTIVE:   CHIEF COMPLAINT / HPI:   Daughter is acting as Optometrist.  Declined personal interpreter services  Dizzy and vomiting Duration: 4 days  Patient is continuously dizzy She has had no HA She has had no new medications She is tolerating PO But has been vomiting 1-2 day She feels it is getting worse getting worse Describes it as the room is spinning She has feels like it has in the past. Although it is not worse when getting upper rolling out of bed.  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  Has had these in the past, however markedly different this time and since that is continuous and not exacerbated by movement.  Patient has been diagnosed with BPPV.  In the past she is taking meclizine, but she is not using it now.  She is also been referred to neuro rehab, has not gone.  Likely an exacerbation of BPPV, but given its continuous, recommend getting head imaging to rule out some type of subacute CVA of cerebellum.  Patient outside the window for TPA. -Meclizine -Referral to neuro rehab -MRI brain -Return precautions discussed -Recommend follow-up in 1 week or sooner if symptoms not improving.     Bonnita Hollow, MD [place]

## 2019-09-18 DIAGNOSIS — H81399 Other peripheral vertigo, unspecified ear: Secondary | ICD-10-CM | POA: Insufficient documentation

## 2019-09-18 NOTE — Assessment & Plan Note (Signed)
Patient presenting with vertiginous symptoms.  Has had these in the past, however markedly different this time and since that is continuous and not exacerbated by movement.  Patient has been diagnosed with BPPV.  In the past she is taking meclizine, but she is not using it now.  She is also been referred to neuro rehab, has not gone.  Likely an exacerbation of BPPV, but given its continuous, recommend getting head imaging to rule out some type of subacute CVA of cerebellum.  Patient outside the window for TPA. -Meclizine -Referral to neuro rehab -MRI brain -Return precautions discussed -Recommend follow-up in 1 week or sooner if symptoms not improving.

## 2019-09-27 ENCOUNTER — Ambulatory Visit (HOSPITAL_COMMUNITY)
Admission: RE | Admit: 2019-09-27 | Discharge: 2019-09-27 | Disposition: A | Discharge status: Home / Self Care | Payer: Medicare HMO | Source: Ambulatory Visit | Attending: Family Medicine | Admitting: Family Medicine

## 2019-09-27 ENCOUNTER — Other Ambulatory Visit: Payer: Self-pay

## 2019-09-27 DIAGNOSIS — H81399 Other peripheral vertigo, unspecified ear: Secondary | ICD-10-CM | POA: Diagnosis not present

## 2019-09-27 DIAGNOSIS — R42 Dizziness and giddiness: Secondary | ICD-10-CM | POA: Diagnosis not present

## 2019-10-02 ENCOUNTER — Ambulatory Visit (HOSPITAL_COMMUNITY): Payer: Medicare HMO

## 2019-10-08 ENCOUNTER — Ambulatory Visit: Payer: Medicare HMO | Admitting: Physical Therapy

## 2019-10-08 ENCOUNTER — Other Ambulatory Visit: Payer: Self-pay

## 2019-10-08 VITALS — BP 150/80 | HR 90

## 2019-10-08 DIAGNOSIS — R2681 Unsteadiness on feet: Secondary | ICD-10-CM

## 2019-10-08 DIAGNOSIS — M25662 Stiffness of left knee, not elsewhere classified: Secondary | ICD-10-CM | POA: Diagnosis not present

## 2019-10-08 DIAGNOSIS — R262 Difficulty in walking, not elsewhere classified: Secondary | ICD-10-CM

## 2019-10-08 DIAGNOSIS — M25561 Pain in right knee: Secondary | ICD-10-CM | POA: Diagnosis not present

## 2019-10-08 DIAGNOSIS — M25562 Pain in left knee: Secondary | ICD-10-CM | POA: Diagnosis not present

## 2019-10-08 DIAGNOSIS — R42 Dizziness and giddiness: Secondary | ICD-10-CM | POA: Diagnosis not present

## 2019-10-08 DIAGNOSIS — G8929 Other chronic pain: Secondary | ICD-10-CM | POA: Diagnosis not present

## 2019-10-08 DIAGNOSIS — M6281 Muscle weakness (generalized): Secondary | ICD-10-CM | POA: Diagnosis not present

## 2019-10-08 NOTE — Therapy (Signed)
[facility] [address] [place], [place], [address] Phone: [phone]   Fax:  [phone]  Physical Therapy Evaluation  Patient Details  Name: Theresa Duran MRN: [id number] Date of Birth: 1945-08-26 Referring Provider (PT): Bonnita Hollow, MD (resident) cosigned by McDiarmid, Blane Ohara, MD   Encounter Date: 10/08/2019   PT End of Session - 10/08/19 2101    Visit Number 1   vestibular   Number of Visits 13   vestibular   Date for PT Re-Evaluation 11/22/19   vestibular   Authorization Type [employer] MCR/MCD    Progress Note Due on Visit 10    PT Start Time 1540    PT Stop Time 1620    PT Time Calculation (min) 40 min    Activity Tolerance Treatment limited secondary to medical complications (Comment)    Behavior During Therapy [facility] for tasks assessed/performed           Past Medical History:  Diagnosis Date  . Hyperlipidemia   . Personal history of colonic polyps-adenoma 05/14/2013    Past Surgical History:  Procedure Laterality Date  . ABDOMINAL HYSTERECTOMY     total vaginal with BSO, secondary to prolapse    Vitals:   10/08/19 1553  BP: (!) 150/80  Pulse: 90  SpO2: 97%      Subjective Assessment - 10/08/19 1544    Subjective Pt evaluated at [facility]. for knee pain; noted dizziness at evaluation at [facility]. but at that visit pt had elevated BP.  Went to see PCP who gave her a prescription for dizziness and referred her to Neuro PT.  Pt was having dizziness before the [facility]. appointment but after the [facility]. appointment pt had such bad symptoms, she vomited after that evaluation.  Symptoms started 2 days before [facility]. appointment.  Symptoms are better but still there.    Patient is accompained by: Interpreter;Family member    Pertinent History HTN, HLD    Patient Stated Goals To get rid of the dizziness    Currently in Pain? Yes              [facility] PT Assessment - 10/08/19 1558       Assessment   Medical Diagnosis Vertigo, laterality unspecified    Referring Provider (PT) Bonnita Hollow, MD (resident) cosigned by McDiarmid, Blane Ohara, MD    Onset Date/Surgical Date 09/17/19    Prior Therapy Evaluated at [facility]. for knee pain      Precautions   Precautions Other (comment)    Precaution Comments HLD, HTN      Balance Screen   Has the patient fallen in the past 6 months No      [place] residence    Living Arrangements Children    Type of [place]    Additional Comments reports knees are biggest barriers to negotiating stairs      Prior Function   Level of Independence Independent      Observation/Other Assessments   Focus on Therapeutic Outcomes (FOTO)  34%    Other Surveys  Dizziness Handicap Inventory (DHI)    Dizziness Handicap Inventory (DHI)  76      Sensation   Light Touch Appears Intact      ROM / Strength   AROM / PROM / Strength Strength      Strength   Overall Strength Deficits;Due to pain    Overall Strength Comments equal on  both sides grossly, limited due to pain      Ambulation/Gait   Ambulation/Gait Yes    Ambulation/Gait Assistance 4: Min guard    Ambulation Distance (Feet) 100 Feet    Assistive device None    Gait Pattern Decreased step length - right;Decreased step length - left;Shuffle;Poor foot clearance - left;Poor foot clearance - right    Ambulation Surface Level;Indoor                  Vestibular Assessment - 10/08/19 1604      Symptom Behavior   Subjective history of current problem Began about 3 weeks ago; woke up with it - vomiting and then diarrhea.  Had cataract surgery recently.  Reports having some tinnitus L side.  No headache currently.    Type of Dizziness  Spinning    Frequency of Dizziness intermittent    Duration of Dizziness minutes to hours    Symptom Nature Spontaneous    Aggravating Factors Lying supine;Supine to sit    Relieving Factors Closing eyes     Progression of Symptoms Better      Oculomotor Exam   Oculomotor Alignment Normal    Ocular ROM WFL    Spontaneous Absent    Gaze-induced  Absent    Smooth Pursuits Intact    Saccades Intact      Oculomotor Exam-Fixation Suppressed    Left Head Impulse positive    Right Head Impulse positive      Vestibulo-Ocular Reflex   VOR to Slow Head Movement Normal    VOR Cancellation Normal      Positional Testing   Dix-Hallpike Dix-Hallpike Right;Dix-Hallpike Left    Horizontal Canal Testing Horizontal Canal Right;Horizontal Canal Left      Dix-Hallpike Right   Dix-Hallpike Right Duration 30 seconds    Dix-Hallpike Right Symptoms Other (comment)   L rotary nystagmus     Dix-Hallpike Left   Dix-Hallpike Left Duration 0    Dix-Hallpike Left Symptoms No nystagmus      Horizontal Canal Right   Horizontal Canal Right Duration 30 seconds    Horizontal Canal Right Symptoms Nystagmus   L rotary     Horizontal Canal Left   Horizontal Canal Left Duration 30 seconds    Horizontal Canal Left Symptoms Normal   spinning but no nystagmus     Positional Sensitivities   Sit to Supine Mild dizziness    Supine to Left Side No dizziness    Supine to Right Side Mild dizziness    Supine to Sitting Moderate dizziness    Right Hallpike Mild dizziness    Up from Right Hallpike Moderate dizziness    Up from Left Hallpike Moderate dizziness    Rolling Right Mild dizziness    Rolling Left Mild dizziness              Objective measurements completed on examination: See above findings.               PT Education - 10/08/19 2101    Education Details clinical findings, PT POC and goals    Person(s) Educated Patient;Child(ren)    Methods Explanation    Comprehension Verbalized understanding            PT Short Term Goals - 10/08/19 2112      PT SHORT TERM GOAL #2   Title Vestibular STG: pt will initiate vestibular/balance HEP    Baseline not initiated to date    Time 3     Period  Weeks    Status New    Target Date 10/29/19      PT SHORT TERM GOAL #3   Title Pt will report 1/5 dizziness when performing supine <> sit and rolling    Baseline 2-3/5 currently    Time 3    Period Weeks    Status New    Target Date 10/29/19             PT Long Term Goals - 10/08/19 2115      Additional Long Term Goals   Additional Long Term Goals Yes      PT LONG TERM GOAL #6   Title Vestibular LTG: pt will demonstrated independence with final vestibular/balance HEP    Time 6    Period Weeks    Status New    Target Date 11/22/19      PT LONG TERM GOAL #7   Title Pt will report 0/5 dizziness with bed mobility, bending and turns    Time 6    Period Weeks    Status New    Target Date 11/22/19      PT LONG TERM GOAL #8   Title Pt will increase FOTO to >59% and will decrease DHI by 18 points    Baseline 34% FOTO; DHI 76 - severe    Time 6    Period Weeks    Status New    Target Date 11/22/19                  Plan - 10/08/19 2102    Clinical Impression Statement Patient is a 74 year old female who was previously evaluated at the [facility] office for bilateral knee pain and was unable to complete full evaluation due to dizziness and elevated BP; pt now referred to outpatient neurorehabilitation for assessment and treatment of vertigo.  Pt's PMH is significant for HTN and HLD.  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  Pt would benefit from skilled PT services to address these impairments to maximize functional mobility independence and decrease falls risk.  Anticipate that when pt has  completed vestibular rehab she will be able to fully participate in orthopedic PT.    Personal Factors and Comorbidities Comorbidity 1;Transportation    Comorbidities HTN    Examination-Activity Limitations Bed Mobility;Locomotion Level;Stairs;Stand;Transfers    Examination-Participation Restrictions Community Activity;Cleaning;Meal Prep    Stability/Clinical Decision Making Stable/Uncomplicated    Clinical Decision Making Low    Rehab Potential Good    PT Frequency 2x / week    PT Duration 6 weeks    PT Treatment/Interventions ADLs/Self Care Home Management;Canalith Repostioning;Gait training;Functional mobility training;Therapeutic activities;Therapeutic exercise;Balance training;Neuromuscular re-education;Patient/family education;Vestibular    PT Next Visit Plan re-assess for BPPV-L?  treat if indicated.  Assess DVA if able with language barrier??  Finish MSQ. Initiate HEP - x1 viewing, habituation, balance    Consulted and Agree with Plan of Care Patient           Patient will benefit from skilled therapeutic intervention in order to improve the following deficits and impairments:  Decreased balance, Difficulty walking, Dizziness  Visit Diagnosis: Dizziness and giddiness  Unsteadiness on feet  Difficulty  in walking, not elsewhere classified     Problem List Patient Active Problem List   Diagnosis Date Noted  . Other peripheral vertigo, unspecified ear 09/18/2019  . Screen for colon cancer 09/03/2019  . Chronic pain of both knees 09/03/2019  . Brittle nails 01/23/2019  . Hair loss 01/23/2019  . Screening for HIV (human immunodeficiency virus) 01/23/2019  . Nail fragility 01/08/2019  . Vertigo 09/13/2018  . Pain of upper abdomen 06/13/2017  . Pain in both feet 11/03/2016  . Leg swelling 11/03/2016  . Chest pain 01/05/2016  . Encounter for immunization 01/05/2016  . Osteoporosis 08/14/2015  . Cystocele 05/22/2015  . Urinary urgency 03/25/2015  . UTI (urinary tract  infection) 02/01/2014  . Personal history of colonic polyps-adenoma 05/14/2013  . Essential hypertension, benign 01/14/2012  . Osteoarthritis of left knee 12/28/2011  . 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 Cough 11/09/2011  . Hyperlipidemia 08/18/2011  . Cataract 08/18/2011  . Low back pain 08/18/2011  . Muscle cramps at night 08/18/2011    Rico Junker, PT, DPT 10/08/19    9:21 PM    [facility] [address] [place], [place], [address] Phone: [phone]   Fax:  [phone]  Name: Theresa Duran MRN: [id number] Date of Birth: 1946-01-24

## 2019-10-11 ENCOUNTER — Other Ambulatory Visit: Payer: Self-pay

## 2019-10-11 ENCOUNTER — Encounter: Payer: Self-pay | Admitting: Physical Therapy

## 2019-10-11 ENCOUNTER — Ambulatory Visit: Payer: Medicare HMO | Attending: Family Medicine | Admitting: Physical Therapy

## 2019-10-11 DIAGNOSIS — R42 Dizziness and giddiness: Secondary | ICD-10-CM

## 2019-10-11 DIAGNOSIS — R2681 Unsteadiness on feet: Secondary | ICD-10-CM | POA: Diagnosis not present

## 2019-10-11 DIAGNOSIS — R262 Difficulty in walking, not elsewhere classified: Secondary | ICD-10-CM | POA: Diagnosis not present

## 2019-10-11 NOTE — Therapy (Signed)
[facility] [address] [place], [place], [address] Phone: [phone]   Fax:  [phone]  Physical Therapy Treatment  Patient Details  Name: Theresa Duran MRN: [id number] Date of Birth: 01-03-1946 Referring Provider (PT): Bonnita Hollow, MD (resident) cosigned by McDiarmid, Blane Ohara, MD   Encounter Date: 10/11/2019   PT End of Session - 10/11/19 1142    Visit Number 2    Number of Visits 13   vestibular   Date for PT Re-Evaluation 11/22/19   vestibular   Authorization Type [employer] MCR/MCD    Progress Note Due on Visit 10    PT Start Time 1108    PT Stop Time 1140    PT Time Calculation (min) 32 min    Activity Tolerance Treatment limited secondary to medical complications (Comment)    Behavior During Therapy [facility] for tasks assessed/performed           Past Medical History:  Diagnosis Date  . Hyperlipidemia   . Personal history of colonic polyps-adenoma 05/14/2013    Past Surgical History:  Procedure Laterality Date  . ABDOMINAL HYSTERECTOMY     total vaginal with BSO, secondary to prolapse    There were no vitals filed for this visit.   Subjective Assessment - 10/11/19 1112    Subjective Feeling much better, only a little bit of dizziness.  Had some sudden pain on R side that went into low back.    Patient is accompained by: Interpreter;Family member    Pertinent History HTN, HLD    Patient Stated Goals To get rid of the dizziness    Currently in Pain? Yes              [facility] PT Assessment - 10/11/19 1130      Functional Gait  Assessment   Gait assessed  Yes    Change in Gait Speed Able to smoothly change walking speed without loss of balance or gait deviation. Deviate no more than 6 in outside of the 12 in walkway width.    Gait with Horizontal Head Turns Performs head turns smoothly with no change in gait. Deviates no more than 6 in outside 12 in walkway width    Gait with Vertical Head Turns  Performs head turns with no change in gait. Deviates no more than 6 in outside 12 in walkway width.    Gait and Pivot Turn Pivot turns safely within 3 sec and stops quickly with no loss of balance.               Vestibular Assessment - 10/11/19 1124      Positional Testing   Dix-Hallpike Dix-Hallpike Right;Dix-Hallpike Left    Horizontal Canal Testing Horizontal Canal Right;Horizontal Canal Left      Dix-Hallpike Right   Dix-Hallpike Right Duration 0    Dix-Hallpike Right Symptoms No nystagmus      Dix-Hallpike Left   Dix-Hallpike Left Duration 0    Dix-Hallpike Left Symptoms No nystagmus      Horizontal Canal Right   Horizontal Canal Right Duration 0    Horizontal Canal Right Symptoms Normal      Horizontal Canal Left   Horizontal Canal Left Duration 0    Horizontal Canal Left Symptoms Normal      Positional Sensitivities   Sit to Supine No dizziness    Supine to Left Side No dizziness    Supine to Right Side No dizziness    Supine to Sitting No dizziness  Right Hallpike No dizziness    Up from Right Hallpike No dizziness    Up from Left Hallpike No dizziness    Nose to Right Knee No dizziness    Right Knee to Sitting No dizziness    Nose to Left Knee No dizziness    Left Knee to Sitting No dizziness    Head Turning x 5 No dizziness    Head Nodding x 5 No dizziness    Pivot Right in Standing No dizziness    Pivot Left in Standing No dizziness    Rolling Right No dizziness    Rolling Left No dizziness                   PT Education - 10/11/19 1141    Education Details No dizziness today, will follow up in one week and if dizziness is better, will D/C from vestibular rehab, if dizziness returns, will continue with visits    Person(s) Educated Patient;Child(ren)    Methods Explanation    Comprehension Verbalized understanding            PT Short Term Goals - 10/08/19 2112      PT SHORT TERM GOAL #2   Title Vestibular STG: pt will initiate  vestibular/balance HEP    Baseline not initiated to date    Time 3    Period Weeks    Status New    Target Date 10/29/19      PT SHORT TERM GOAL #3   Title Pt will report 1/5 dizziness when performing supine <> sit and rolling    Baseline 2-3/5 currently    Time 3    Period Weeks    Status New    Target Date 10/29/19             PT Long Term Goals - 10/08/19 2115      Additional Long Term Goals   Additional Long Term Goals Yes      PT LONG TERM GOAL #6   Title Vestibular LTG: pt will demonstrated independence with final vestibular/balance HEP    Time 6    Period Weeks    Status New    Target Date 11/22/19      PT LONG TERM GOAL #7   Title Pt will report 0/5 dizziness with bed mobility, bending and turns    Time 6    Period Weeks    Status New    Target Date 11/22/19      PT LONG TERM GOAL #8   Title Pt will increase FOTO to >59% and will decrease DHI by 18 points    Baseline 34% FOTO; DHI 76 - severe    Time 6    Period Weeks    Status New    Target Date 11/22/19                 Plan - 10/11/19 1740    Clinical Impression Statement Pt returns with no symptoms of dizziness with positional testing and unable to provoke symptoms with MSQ or portions of FGA.  Pt also not demonstrating any imbalance during gait with higher level challenges.  No CRM needed today and no HEP provided today.  Will plan for pt to return for one more visit; if pt continues to be symptom free, will proceed with D/C.  If symptoms return will continue to assess and treat as indicated.  Pt and daughter made aware of plan and in agreement.    Personal Factors  and Comorbidities Comorbidity 1;Transportation    Comorbidities HTN    Examination-Activity Limitations Bed Mobility;Locomotion Level;Stairs;Stand;Transfers    Examination-Participation Restrictions Community Activity;Cleaning;Meal Prep    Stability/Clinical Decision Making Stable/Uncomplicated    Rehab Potential Good    PT  Frequency 2x / week    PT Duration 6 weeks    PT Treatment/Interventions ADLs/Self Care Home Management;Canalith Repostioning;Gait training;Functional mobility training;Therapeutic activities;Therapeutic exercise;Balance training;Neuromuscular re-education;Patient/family education;Vestibular    PT Next Visit Plan did dizziness return?  If not, perform D/C FOTO with daughter and send to me for D/C.  If dizziness returned, repeat positional testing and treat as indicated - keep appointments if dizziness returns.  If D/C cancel remaining appt.  Ask pt is she wants to return to [address] [address] for PT for knee OA?    Consulted and Agree with Plan of Care Patient           Patient will benefit from skilled therapeutic intervention in order to improve the following deficits and impairments:  Decreased balance, Difficulty walking, Dizziness  Visit Diagnosis: Dizziness and giddiness  Unsteadiness on feet  Difficulty in walking, not elsewhere classified     Problem List Patient Active Problem List   Diagnosis Date Noted  . Other peripheral vertigo, unspecified ear 09/18/2019  . Screen for colon cancer 09/03/2019  . Chronic pain of both knees 09/03/2019  . Brittle nails 01/23/2019  . Hair loss 01/23/2019  . Screening for HIV (human immunodeficiency virus) 01/23/2019  . Nail fragility 01/08/2019  . Vertigo 09/13/2018  . Pain of upper abdomen 06/13/2017  . Pain in both feet 11/03/2016  . Leg swelling 11/03/2016  . Chest pain 01/05/2016  . Encounter for immunization 01/05/2016  . Osteoporosis 08/14/2015  . Cystocele 05/22/2015  . Urinary urgency 03/25/2015  . UTI (urinary tract infection) 02/01/2014  . Personal history of colonic polyps-adenoma 05/14/2013  . Essential hypertension, benign 01/14/2012  . Osteoarthritis of left knee 12/28/2011  . 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 Cough 11/09/2011  . Hyperlipidemia 08/18/2011  . Cataract 08/18/2011  . Low back pain 08/18/2011  . Muscle cramps  at night 08/18/2011    Rico Junker, PT, DPT 10/11/19    5:44 PM    [facility] [address] [place], [place], [address] Phone: [phone]   Fax:  [phone]  Name: Miriana Gaertner MRN: [id number] Date of Birth: 05-24-1945

## 2019-10-18 ENCOUNTER — Ambulatory Visit: Payer: Medicare HMO | Admitting: Physical Therapy

## 2019-10-18 ENCOUNTER — Ambulatory Visit: Payer: Medicare HMO

## 2019-10-18 ENCOUNTER — Other Ambulatory Visit: Payer: Self-pay

## 2019-10-18 DIAGNOSIS — R42 Dizziness and giddiness: Secondary | ICD-10-CM | POA: Diagnosis not present

## 2019-10-18 DIAGNOSIS — R2681 Unsteadiness on feet: Secondary | ICD-10-CM | POA: Diagnosis not present

## 2019-10-18 DIAGNOSIS — R262 Difficulty in walking, not elsewhere classified: Secondary | ICD-10-CM | POA: Diagnosis not present

## 2019-10-18 NOTE — Therapy (Addendum)
[facility] [address] [place], [place], [address] Phone: [phone]   Fax:  [phone]  Physical Therapy Treatment and D/C Summary  Patient Details  Name: Theresa Duran MRN: [id number] Date of Birth: 04/24/1945 Referring Provider (PT): Bonnita Hollow, MD (resident) cosigned by McDiarmid, Blane Ohara, MD   Encounter Date: 10/18/2019   PT End of Session - 10/18/19 1735    Visit Number 3    Number of Visits 13   vestibular   Date for PT Re-Evaluation 11/22/19   vestibular   Authorization Type [employer] MCR/MCD    Progress Note Due on Visit 10    PT Start Time 1701    PT Stop Time 1731    PT Time Calculation (min) 30 min    Activity Tolerance Treatment limited secondary to medical complications (Comment)    Behavior During Therapy [facility] for tasks assessed/performed           Past Medical History:  Diagnosis Date  . Hyperlipidemia   . Personal history of colonic polyps-adenoma 05/14/2013    Past Surgical History:  Procedure Laterality Date  . ABDOMINAL HYSTERECTOMY     total vaginal with BSO, secondary to prolapse    There were no vitals filed for this visit.   Subjective Assessment - 10/18/19 1705    Subjective Patient reports has felt good. Has not had a single episode of dizziness and believes symptoms are resolved.    Patient is accompained by: Interpreter;Family member    Pertinent History HTN, HLD    Patient Stated Goals To get rid of the dizziness    Currently in Pain? Yes    Pain Score 4     Pain Location Back    Pain Orientation Lower    Pain Descriptors / Indicators Aching              [facility] PT Assessment - 10/18/19 0001      Observation/Other Assessments   Focus on Therapeutic Outcomes (FOTO)  Completed Discharge FOTO with patient/daughter: Patient scored 90% FOTO    Dizziness Handicap Inventory [facility])  2                         [facility] Adult PT Treatment/Exercise - 10/18/19  0001      Self-Care   Self-Care Other Self-Care Comments    Other Self-Care Comments  Educated patient on returning to PT services for B Knee pain at [facility].                  PT Education - 10/18/19 1928    Education Details Educated on d/c due to no reports of dizziness. Educated on reaching back out to resume therapy servicces at [facility] for B Knee Pain.    Person(s) Educated Patient;Child(ren)    Methods Explanation    Comprehension Verbalized understanding            PT Short Term Goals - 10/18/19 1726      PT SHORT TERM GOAL #2   Title Vestibular STG: pt will initiate vestibular/balance HEP    Baseline no HEP initiated due being unable to provoke dizziness, and no more complaints of dizziness via patient.    Time 3    Period Weeks    Status Deferred    Target Date 10/29/19      PT SHORT TERM GOAL #3   Title Pt will report 1/5 dizziness when performing supine <>  sit and rolling    Baseline 2-3/5 currently, 0/5 on 10/18/19    Time 3    Period Weeks    Status Achieved    Target Date 10/29/19             PT Long Term Goals - 10/18/19 1931      PT LONG TERM GOAL #6   Title Vestibular LTG: pt will demonstrated independence with final vestibular/balance HEP    Baseline no HEP initiated due being unable to provoke dizziness, and no more complaints of dizziness via patient    Time 6    Period Weeks    Status Deferred      PT LONG TERM GOAL #7   Title Pt will report 0/5 dizziness with bed mobility, bending and turns    Baseline patient reports 0/5 dizziness with these activities    Time 6    Period Weeks    Status Achieved      PT LONG TERM GOAL #8   Title Pt will increase FOTO to >59% and will decrease DHI by 18 points    Baseline 34% FOTO; DHI 76 - severe; 10/18/19 - 90% FOTO, DHI 2    Time 6    Period Weeks    Status Achieved                 Plan - 10/18/19 1937    Clinical Impression Statement Patient returned to  session today with no episodes or symptoms of dizziness, with patient reporting they have resolved. Due to inability to provoke symptoms with prior visit and no symptoms no HEP was provided, and that LTG was deferred. Patient did demonstrate signficant improvement with her [place] with a score of 2. Patient also reports no dizziness with any functional mobility. Patient and daughter agree to d//c at this time. PT encouraging patient to restart PT services for Knee OA [address] [facility].    Personal Factors and Comorbidities Comorbidity 1;Transportation    Comorbidities HTN    Examination-Activity Limitations Bed Mobility;Locomotion Level;Stairs;Stand;Transfers    Examination-Participation Restrictions Community Activity;Cleaning;Meal Prep    Stability/Clinical Decision Making Stable/Uncomplicated    Rehab Potential Good    PT Frequency 2x / week    PT Duration 6 weeks    PT Treatment/Interventions ADLs/Self Care Home Management;Canalith Repostioning;Gait training;Functional mobility training;Therapeutic activities;Therapeutic exercise;Balance training;Neuromuscular re-education;Patient/family education;Vestibular    Consulted and Agree with Plan of Care Patient           Patient will benefit from skilled therapeutic intervention in order to improve the following deficits and impairments:  Decreased balance, Difficulty walking, Dizziness  Visit Diagnosis: Dizziness and giddiness  Unsteadiness on feet     Problem List Patient Active Problem List   Diagnosis Date Noted  . Other peripheral vertigo, unspecified ear 09/18/2019  . Screen for colon cancer 09/03/2019  . Chronic pain of both knees 09/03/2019  . Brittle nails 01/23/2019  . Hair loss 01/23/2019  . Screening for HIV (human immunodeficiency virus) 01/23/2019  . Nail fragility 01/08/2019  . Vertigo 09/13/2018  . Pain of upper abdomen 06/13/2017  . Pain in both feet 11/03/2016  . Leg swelling 11/03/2016  . Chest pain  01/05/2016  . Encounter for immunization 01/05/2016  . Osteoporosis 08/14/2015  . Cystocele 05/22/2015  . Urinary urgency 03/25/2015  . UTI (urinary tract infection) 02/01/2014  . Personal history of colonic polyps-adenoma 05/14/2013  . Essential hypertension, benign 01/14/2012  . Osteoarthritis of left knee 12/28/2011  . 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  Cough 11/09/2011  . Hyperlipidemia 08/18/2011  . Cataract 08/18/2011  . Low back pain 08/18/2011  . Muscle cramps at night 08/18/2011    Jones Bales, PT, DPT 10/18/2019, 7:41 PM    PHYSICAL THERAPY DISCHARGE SUMMARY  Visits from Start of Care: 3  Current functional level related to goals / functional outcomes: All goals met and symptoms have fully resolved.     Remaining deficits: Knee pain; educated on return to [facility] for continued rehabilitation for knee   Education / Equipment: No HEP required  Plan: Patient agrees to discharge.  Patient goals were met. Patient is being discharged due to meeting the stated rehab goals.  ?????     Rico Junker, PT, DPT 10/19/19    9:57 AM    [facility] [address] [place], [place], [address] Phone: [phone]   Fax:  [phone]  Name: Jeania Nater MRN: [id number] Date of Birth: [birth year]

## 2019-10-19 ENCOUNTER — Ambulatory Visit: Payer: Medicare HMO | Admitting: Physical Therapy

## 2019-10-22 DIAGNOSIS — H43821 Vitreomacular adhesion, right eye: Secondary | ICD-10-CM | POA: Diagnosis not present

## 2019-10-22 DIAGNOSIS — H35372 Puckering of macula, left eye: Secondary | ICD-10-CM | POA: Diagnosis not present

## 2019-10-22 DIAGNOSIS — H442D3 Degenerative myopia with foveoschisis, bilateral eye: Secondary | ICD-10-CM | POA: Diagnosis not present

## 2019-10-22 DIAGNOSIS — H35343 Macular cyst, hole, or pseudohole, bilateral: Secondary | ICD-10-CM | POA: Diagnosis not present

## 2019-10-22 DIAGNOSIS — H35352 Cystoid macular degeneration, left eye: Secondary | ICD-10-CM | POA: Diagnosis not present

## 2019-10-24 ENCOUNTER — Ambulatory Visit: Payer: Medicare HMO

## 2019-10-30 ENCOUNTER — Ambulatory Visit: Payer: Medicare HMO

## 2019-11-06 ENCOUNTER — Encounter: Payer: Medicare HMO | Admitting: Physical Therapy

## 2019-11-07 ENCOUNTER — Encounter: Payer: Medicare HMO | Admitting: Physical Therapy

## 2019-11-08 ENCOUNTER — Encounter: Payer: Medicare HMO | Admitting: Physical Therapy

## 2019-11-09 ENCOUNTER — Telehealth: Payer: Self-pay

## 2019-11-09 ENCOUNTER — Other Ambulatory Visit: Payer: Self-pay | Admitting: Family Medicine

## 2019-11-09 ENCOUNTER — Ambulatory Visit: Payer: Medicare HMO

## 2019-11-09 DIAGNOSIS — M25561 Pain in right knee: Secondary | ICD-10-CM

## 2019-11-09 NOTE — Progress Notes (Signed)
Ref to PT repeated at patient request

## 2019-11-09 NOTE — Telephone Encounter (Signed)
Patients daughter calls nurse line requesting a second referral to [facility]. Per daughter, since its been greater than 30 days a new one needs to be submitted. Daughter reports she went there for a "while" then decided to get rehab elsewhere, but now wants to go back for continued leg pain. Please advise.

## 2019-11-09 NOTE — Telephone Encounter (Signed)
Referral placed for physical therapy at [facility]

## 2019-11-13 ENCOUNTER — Encounter: Payer: Medicare HMO | Admitting: Physical Therapy

## 2019-11-16 ENCOUNTER — Encounter: Payer: Medicare HMO | Admitting: Physical Therapy

## 2019-11-20 ENCOUNTER — Encounter: Payer: Medicare HMO | Admitting: Physical Therapy

## 2019-11-23 ENCOUNTER — Encounter: Payer: Medicare HMO | Admitting: Physical Therapy

## 2019-11-27 ENCOUNTER — Ambulatory Visit: Payer: Medicare HMO

## 2019-11-29 ENCOUNTER — Encounter: Payer: Medicare HMO | Admitting: Physical Therapy

## 2019-11-30 ENCOUNTER — Other Ambulatory Visit: Payer: Self-pay

## 2019-11-30 ENCOUNTER — Encounter: Payer: Medicare HMO | Admitting: Physical Therapy

## 2019-11-30 DIAGNOSIS — E785 Hyperlipidemia, unspecified: Secondary | ICD-10-CM

## 2019-12-03 ENCOUNTER — Encounter: Payer: Medicare HMO | Admitting: Physical Therapy

## 2019-12-03 ENCOUNTER — Other Ambulatory Visit: Payer: Self-pay

## 2019-12-03 ENCOUNTER — Ambulatory Visit: Payer: Medicare HMO | Attending: Family Medicine | Admitting: Physical Therapy

## 2019-12-03 DIAGNOSIS — M6281 Muscle weakness (generalized): Secondary | ICD-10-CM | POA: Diagnosis not present

## 2019-12-03 DIAGNOSIS — M25561 Pain in right knee: Secondary | ICD-10-CM | POA: Diagnosis not present

## 2019-12-03 DIAGNOSIS — R262 Difficulty in walking, not elsewhere classified: Secondary | ICD-10-CM | POA: Insufficient documentation

## 2019-12-03 DIAGNOSIS — G8929 Other chronic pain: Secondary | ICD-10-CM

## 2019-12-03 DIAGNOSIS — M25662 Stiffness of left knee, not elsewhere classified: Secondary | ICD-10-CM | POA: Diagnosis not present

## 2019-12-03 DIAGNOSIS — M25562 Pain in left knee: Secondary | ICD-10-CM | POA: Diagnosis not present

## 2019-12-03 MED ORDER — ROSUVASTATIN CALCIUM 5 MG PO TABS: 5.0000 mg | ORAL_TABLET | Freq: Every day | ORAL | 0 refills | Status: DC

## 2019-12-03 NOTE — Addendum Note (Signed)
Addended by: Malachy Moan L on: 12/03/2019 03:46 PM   Modules accepted: Orders

## 2019-12-03 NOTE — Addendum Note (Signed)
Addended by: Malachy Moan L on: 12/03/2019 03:39 PM   Modules accepted: Orders

## 2019-12-03 NOTE — Therapy (Signed)
[place], [place], [address] Phone: [phone]   Fax:  [phone]  Physical Therapy Evaluation  Patient Details  Name: Theresa Duran MRN: [id number] Date of Birth: 1945/10/05 Referring Provider (PT): Zenia Resides, MD   Encounter Date: 12/03/2019   PT End of Session - 12/03/19 1426    Visit Number 1    Number of Visits 12    Date for PT Re-Evaluation 01/14/20    Authorization Type [employer] MCR/MCD    Progress Note Due on Visit 10    PT Start Time 1435    PT Stop Time 1524    PT Time Calculation (min) 49 min    Activity Tolerance Patient tolerated treatment well    Behavior During Therapy [facility] for tasks assessed/performed           Past Medical History:  Diagnosis Date  . Hyperlipidemia   . Personal history of colonic polyps-adenoma 05/14/2013    Past Surgical History:  Procedure Laterality Date  . ABDOMINAL HYSTERECTOMY     total vaginal with BSO, secondary to prolapse    There were no vitals filed for this visit.    Subjective Assessment - 12/03/19 1435    Subjective Pt reports no change in her knees and is still feeling pain. Reports increased knee pain in standing, walking, and while cooking in the kitchen. Pt reports increased knee pain within the last 2-3 months. Pt reports fall yesterday when she stepped up to get into the house.    Patient is accompained by: Interpreter    Pertinent History HTN, HLD    Limitations Standing;House hold activities;Walking    How long can you stand comfortably? 15-20 minutes    How long can you walk comfortably? 1/2 mile    Patient Stated Goals To improve knee pain    Currently in Pain? Yes    Pain Score 7     Pain Orientation Left;Posterior    Pain Descriptors / Indicators Aching    Pain Type Chronic pain    Pain Score 4    Pain Location Knee    Pain Orientation Right;Posterior    Pain Type Chronic pain    Pain Onset More than a month ago     Pain Frequency Intermittent    Pain Relieving Factors Ginger and salt,              [facility] PT Assessment - 12/03/19 0001      Assessment   Medical Diagnosis M25.561,M25.562,G89.29 (ICD-10-CM) - Chronic pain of both knees    Referring Provider (PT) Hensel, Jamal Collin, MD    Prior Therapy Finished vestibular rehab with [facility]      Balance Screen   Has the patient fallen in the past 6 months Yes    How many times? 1    Has the patient had a decrease in activity level because of a fear of falling?  No    Is the patient reluctant to leave their home because of a fear of falling?  No      Home Environment   Living Environment Private residence    Living Arrangements Children    Available Help at Discharge Family    Type of [place]    Additional Comments reports knees are biggest barriers to negotiating stairs      Prior Function   Level of Independence Independent      Single Leg Stance   Comments 7.5 on R, 4.69 on  L      AROM   Right Knee Extension -10    Right Knee Flexion 120    Left Knee Extension -10    Left Knee Flexion 98      Strength   Right Hip Flexion 4/5    Right Hip Extension 4-/5    Right Hip External Rotation  3+/5    Right Hip Internal Rotation 4/5    Right Hip ABduction 4/5    Left Hip Flexion 4-/5    Left Hip Extension 3/5    Left Hip External Rotation 3+/5    Left Hip Internal Rotation 4/5    Left Hip ABduction 3/5    Right Knee Flexion 4+/5    Right Knee Extension 4+/5    Left Knee Flexion 4/5   pain noted   Left Knee Extension 4/5      Flexibility   Hamstrings 55 deg on R, 70 deg on L      Standardized Balance Assessment   Standardized Balance Assessment Timed Up and Go Test;Five Times Sit to Stand    Five times sit to stand comments  23.81      Timed Up and Go Test   Normal TUG (seconds) 12.59                      Objective measurements completed on examination: See above findings.                 PT Short  Term Goals - 12/03/19 1529      PT SHORT TERM GOAL #1   Title Pt will be independent with initial HEP    Baseline Newly provided    Time 3    Period Weeks    Status New    Target Date 12/24/19      PT SHORT TERM GOAL #2   Title Pt will be able to tolerate standing at least 20 minutes for household tasks    Baseline Tolerates 10-15 minutes    Time 3    Period Weeks    Status New    Target Date 12/24/19      PT SHORT TERM GOAL #3   Title Pt will improve L knee AROM to at least 5 to 110 deg    Time 3    Period Weeks    Status New    Target Date 12/24/19             PT Long Term Goals - 12/03/19 1532      PT LONG TERM GOAL #1   Title Pt will be independent with advanced HEP    Time 6    Period Weeks    Status New    Target Date 01/14/20      PT LONG TERM GOAL #2   Title Pt will have improved bilat knee ROM to 0 to 120 degrees    Time 6    Period Weeks    Status New    Target Date 01/14/20      PT LONG TERM GOAL #3   Title Pt will increase knee and hip strength to at least 4+/5 for improved function    Time 6    Period Weeks    Status New    Target Date 01/14/20      PT LONG TERM GOAL #4   Title Pt will have improved SLS to at least 20 sec bilaterally for decreased fall risk    Time 6  Period Weeks    Status New    Target Date 01/14/20      PT LONG TERM GOAL #5   Title Pt will have TUG score of at least 7.7 sec to be within her age norms for reduced fall risk    Time 6    Period Weeks    Status New    Target Date 01/14/20      PT LONG TERM GOAL #6   Title Vestibular LTG: pt will demonstrated independence with final vestibular/balance HEP    Baseline no HEP initiated due being unable to provoke dizziness, and no more complaints of dizziness via patient    Time 6    Period Weeks    Status Deferred      PT LONG TERM GOAL #7   Title Pt will report 0/5 dizziness with bed mobility, bending and turns    Baseline patient reports 0/5 dizziness with  these activities    Time 6    Period Weeks    Status Achieved      PT LONG TERM GOAL #8   Title Pt will increase FOTO to >59% and will decrease DHI by 18 points    Baseline 34% FOTO; DHI 76 - severe; 10/18/19 - 90% FOTO, DHI 2    Time 6    Period Weeks    Status Achieved                  Plan - 12/03/19 1525    Clinical Impression Statement Pt returns to [facility] for treatment of bilateral knee pain (L worse than R). Pt demonstrates decreased knee ROM, decreased patellar mobility with what appears to be maltracking L patella, bilat hip weakness (L weaker than R), and pain affecting pt's ability to tolerate prolonged standing positions affecting her ability to perform household tasks. Pt would benefit from therapy to address these issues and optimize her level of function.    Personal Factors and Comorbidities Comorbidity 1;Transportation    Comorbidities HTN, arthritis    Examination-Activity Limitations Locomotion Level;Stairs;Stand;Transfers;Squat    Examination-Participation Restrictions Community Activity;Cleaning;Meal Prep    Stability/Clinical Decision Making Stable/Uncomplicated    Clinical Decision Making Low    Rehab Potential Good    PT Frequency 2x / week    PT Duration 6 weeks    PT Treatment/Interventions ADLs/Self Care Home Management;Gait training;Functional mobility training;Therapeutic activities;Therapeutic exercise;Balance training;Neuromuscular re-education;Patient/family education;Aquatic Therapy;Electrical Stimulation;Cryotherapy;Iontophoresis 4mg /ml Dexamethasone;Ultrasound;Moist Heat;Stair training;Manual techniques;Passive range of motion;Dry needling;Taping;Vasopneumatic Device    PT Next Visit Plan Review HEP. Continue to progress knee ROM, quad, plantar flexor, and hip strengthening. Consider including hip stretches.    PT Home Exercise Plan Access Code: [email or web address]    Consulted and Agree with Plan of Care Patient           Patient will benefit from  skilled therapeutic intervention in order to improve the following deficits and impairments:  Decreased range of motion, Difficulty walking, Decreased endurance, Pain, Decreased balance, Hypomobility, Impaired flexibility, Decreased mobility, Decreased strength  Visit Diagnosis: Difficulty in walking, not elsewhere classified  Chronic pain of left knee  Chronic pain of right knee  Stiffness of left knee, not elsewhere classified  Muscle weakness (generalized)     Problem List Patient Active Problem List   Diagnosis Date Noted  . Other peripheral vertigo, unspecified ear 09/18/2019  . Screen for colon cancer 09/03/2019  . Chronic pain of both knees 09/03/2019  . Brittle nails 01/23/2019  . Hair loss 01/23/2019  . Screening for  HIV (human immunodeficiency virus) 01/23/2019  . Nail fragility 01/08/2019  . Vertigo 09/13/2018  . Pain of upper abdomen 06/13/2017  . Pain in both feet 11/03/2016  . Leg swelling 11/03/2016  . Chest pain 01/05/2016  . Encounter for immunization 01/05/2016  . Osteoporosis 08/14/2015  . Cystocele 05/22/2015  . Urinary urgency 03/25/2015  . UTI (urinary tract infection) 02/01/2014  . Personal history of colonic polyps-adenoma 05/14/2013  . Essential hypertension, benign 01/14/2012  . Osteoarthritis of left knee 12/28/2011  . 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 Cough 11/09/2011  . Hyperlipidemia 08/18/2011  . Cataract 08/18/2011  . Low back pain 08/18/2011  . Muscle cramps at night 08/18/2011    [facility] April Ma L Diondra Pines PT, DPT 12/03/2019, 3:37 PM  [facility] [address] [place], [place], [address] Phone: [phone]   Fax:  [phone]  Name: Theresa Duran MRN: [id number] Date of Birth: April 26, 1945

## 2019-12-06 ENCOUNTER — Encounter: Payer: Medicare HMO | Admitting: Physical Therapy

## 2019-12-10 ENCOUNTER — Encounter: Payer: Self-pay | Admitting: Physical Therapy

## 2019-12-10 ENCOUNTER — Ambulatory Visit: Payer: Medicare HMO | Admitting: Physical Therapy

## 2019-12-10 ENCOUNTER — Other Ambulatory Visit: Payer: Self-pay

## 2019-12-10 DIAGNOSIS — G8929 Other chronic pain: Secondary | ICD-10-CM | POA: Diagnosis not present

## 2019-12-10 DIAGNOSIS — M25561 Pain in right knee: Secondary | ICD-10-CM | POA: Diagnosis not present

## 2019-12-10 DIAGNOSIS — R262 Difficulty in walking, not elsewhere classified: Secondary | ICD-10-CM | POA: Diagnosis not present

## 2019-12-10 DIAGNOSIS — M25562 Pain in left knee: Secondary | ICD-10-CM | POA: Diagnosis not present

## 2019-12-10 DIAGNOSIS — M6281 Muscle weakness (generalized): Secondary | ICD-10-CM

## 2019-12-10 DIAGNOSIS — M25662 Stiffness of left knee, not elsewhere classified: Secondary | ICD-10-CM | POA: Diagnosis not present

## 2019-12-10 NOTE — Therapy (Signed)
[place], [place], [address] Phone: [phone]   Fax:  [phone]  Physical Therapy Treatment  Patient Details  Name: Theresa Duran MRN: [id number] Date of Birth: Nov 27, 1945 Referring Provider (PT): Zenia Resides, MD   Encounter Date: 12/10/2019   PT End of Session - 12/10/19 1454    Visit Number 2    Number of Visits 12    Date for PT Re-Evaluation 01/14/20    Authorization Type [employer] MCR/MCD    Progress Note Due on Visit 10    PT Start Time 1449    PT Stop Time 1530    PT Time Calculation (min) 41 min    Activity Tolerance Patient tolerated treatment well    Behavior During Therapy [facility] for tasks assessed/performed           Past Medical History:  Diagnosis Date   Hyperlipidemia    Personal history of colonic polyps-adenoma 05/14/2013    Past Surgical History:  Procedure Laterality Date   ABDOMINAL HYSTERECTOMY     total vaginal with BSO, secondary to prolapse    There were no vitals filed for this visit.   Subjective Assessment - 12/10/19 1452    Subjective Pt states she was able to try the exercises at home and she is feeling better.    Patient is accompained by: Interpreter    Pertinent History HTN, HLD    Limitations Standing;House hold activities;Walking    How long can you stand comfortably? 15-20 minutes    How long can you walk comfortably? 1/2 mile    Patient Stated Goals To improve knee pain    Currently in Pain? Yes    Pain Score 4     Pain Location Knee    Pain Orientation Right    Pain Type Chronic pain    Multiple Pain Sites Yes    Pain Score 4    Pain Location Knee    Pain Orientation Left;Posterior    Pain Onset More than a month ago                             [facility] Adult PT Treatment/Exercise - 12/10/19 0001      Knee/Hip Exercises: Stretches   Passive Hamstring Stretch Both;2 reps;30 seconds    Knee: Self-Stretch to increase  Flexion Both;10 seconds;3 reps      Knee/Hip Exercises: Aerobic   Nustep L5 x 6 min LE & UE      Knee/Hip Exercises: Supine   Bridges Strengthening;Both;2 sets;10 reps    Straight Leg Raises Strengthening;Both;2 sets;10 reps    Other Supine Knee/Hip Exercises clamshell green tband 2x10 reps      Knee/Hip Exercises: Prone   Other Prone Exercises quad set 2 x10 bilat      Manual Therapy   Manual Therapy Joint mobilization    Manual therapy comments Self massage/trigger point release with ball    Joint Mobilization tibiofemoral flexion & extension grade II to III mobs                  PT Education - 12/10/19 1630    Education Details Discussed with pt using ball for self massage of tight left hamstring.    Person(s) Educated Patient    Methods Explanation;Demonstration;Verbal cues;Handout    Comprehension Verbalized understanding;Returned demonstration;Verbal cues required;Tactile cues required            PT Short Term  Goals - 12/03/19 1529      PT SHORT TERM GOAL #1   Title Pt will be independent with initial HEP    Baseline Newly provided    Time 3    Period Weeks    Status New    Target Date 12/24/19      PT SHORT TERM GOAL #2   Title Pt will be able to tolerate standing at least 20 minutes for household tasks    Baseline Tolerates 10-15 minutes    Time 3    Period Weeks    Status New    Target Date 12/24/19      PT SHORT TERM GOAL #3   Title Pt will improve L knee AROM to at least 5 to 110 deg    Time 3    Period Weeks    Status New    Target Date 12/24/19             PT Long Term Goals - 12/03/19 1532      PT LONG TERM GOAL #1   Title Pt will be independent with advanced HEP    Time 6    Period Weeks    Status New    Target Date 01/14/20      PT LONG TERM GOAL #2   Title Pt will have improved bilat knee ROM to 0 to 120 degrees    Time 6    Period Weeks    Status New    Target Date 01/14/20      PT LONG TERM GOAL #3   Title Pt will  increase knee and hip strength to at least 4+/5 for improved function    Time 6    Period Weeks    Status New    Target Date 01/14/20      PT LONG TERM GOAL #4   Title Pt will have improved SLS to at least 20 sec bilaterally for decreased fall risk    Time 6    Period Weeks    Status New    Target Date 01/14/20      PT LONG TERM GOAL #5   Title Pt will have TUG score of at least 7.7 sec to be within her age norms for reduced fall risk    Time 6    Period Weeks    Status New    Target Date 01/14/20      PT LONG TERM GOAL #6   Title Vestibular LTG: pt will demonstrated independence with final vestibular/balance HEP    Baseline no HEP initiated due being unable to provoke dizziness, and no more complaints of dizziness via patient    Time 6    Period Weeks    Status Deferred      PT LONG TERM GOAL #7   Title Pt will report 0/5 dizziness with bed mobility, bending and turns    Baseline patient reports 0/5 dizziness with these activities    Time 6    Period Weeks    Status Achieved      PT LONG TERM GOAL #8   Title Pt will increase FOTO to >59% and will decrease DHI by 18 points    Baseline 34% FOTO; DHI 76 - severe; 10/18/19 - 90% FOTO, [place] 2    Time 6    Period Weeks    Status Achieved                 Plan - 12/10/19 1515  Clinical Impression Statement Pt with improving knee pain. Pt with good improvement in ROM. Pt with continued L hamstring tightness on L LE causing posterior knee pain -- addressed with hamstring stretching and manual therapy. Continued to progress pt's quad strengthening and hip strengthening exercises as able. Pt tolerated treatment well without issue. Pt mostly feels pain in L knee during flexion, R knee during extension.    Personal Factors and Comorbidities Comorbidity 1;Transportation    Comorbidities HTN, arthritis    Examination-Activity Limitations Locomotion Level;Stairs;Stand;Transfers;Squat    Examination-Participation Restrictions  Community Activity;Cleaning;Meal Prep    Stability/Clinical Decision Making Stable/Uncomplicated    Rehab Potential Good    PT Frequency 2x / week    PT Duration 6 weeks    PT Treatment/Interventions ADLs/Self Care Home Management;Gait training;Functional mobility training;Therapeutic activities;Therapeutic exercise;Balance training;Neuromuscular re-education;Patient/family education;Aquatic Therapy;Electrical Stimulation;Cryotherapy;Iontophoresis 4mg /ml Dexamethasone;Ultrasound;Moist Heat;Stair training;Manual techniques;Passive range of motion;Dry needling;Taping;Vasopneumatic Device    PT Next Visit Plan Review HEP. Continue to progress knee ROM, quad, plantar flexor, and hip strengthening. Consider including hip stretches.    PT Home Exercise Plan Access Code: [email or web address]. SLR, quad set in prone, hamstring stretch bilat, self knee flexion stretch    Consulted and Agree with Plan of Care Patient           Patient will benefit from skilled therapeutic intervention in order to improve the following deficits and impairments:  Decreased range of motion, Difficulty walking, Decreased endurance, Pain, Decreased balance, Hypomobility, Impaired flexibility, Decreased mobility, Decreased strength  Visit Diagnosis: Difficulty in walking, not elsewhere classified  Chronic pain of left knee  Chronic pain of right knee  Stiffness of left knee, not elsewhere classified  Muscle weakness (generalized)     Problem List Patient Active Problem List   Diagnosis Date Noted   Other peripheral vertigo, unspecified ear 09/18/2019   Screen for colon cancer 09/03/2019   Chronic pain of both knees 09/03/2019   Brittle nails 01/23/2019   Hair loss 01/23/2019   Screening for HIV (human immunodeficiency virus) 01/23/2019   Nail fragility 01/08/2019   Vertigo 09/13/2018   Pain of upper abdomen 06/13/2017   Pain in both feet 11/03/2016   Leg swelling 11/03/2016   Chest pain 01/05/2016    Encounter for immunization 01/05/2016   Osteoporosis 08/14/2015   Cystocele 05/22/2015   Urinary urgency 03/25/2015   UTI (urinary tract infection) 02/01/2014   Personal history of colonic polyps-adenoma 05/14/2013   Essential hypertension, benign 01/14/2012   Osteoarthritis of left knee 12/28/2011   Crystalluria 12/28/2011   Cough 11/09/2011   Hyperlipidemia 08/18/2011   Cataract 08/18/2011   Low back pain 08/18/2011   Muscle cramps at night 08/18/2011    [facility] April Gordy Levan PT, DPT 12/10/2019, 4:31 PM  [facility] [address] [place], [place], [address] Phone: [phone]   Fax:  [phone]  Name: Legna Mausolf MRN: [id number] Date of Birth: [birth year]

## 2019-12-18 ENCOUNTER — Ambulatory Visit: Payer: Medicare HMO | Attending: Family Medicine | Admitting: Physical Therapy

## 2019-12-18 ENCOUNTER — Encounter: Payer: Self-pay | Admitting: Physical Therapy

## 2019-12-18 ENCOUNTER — Other Ambulatory Visit: Payer: Self-pay

## 2019-12-18 DIAGNOSIS — R262 Difficulty in walking, not elsewhere classified: Secondary | ICD-10-CM | POA: Diagnosis not present

## 2019-12-18 DIAGNOSIS — M6281 Muscle weakness (generalized): Secondary | ICD-10-CM | POA: Diagnosis not present

## 2019-12-18 DIAGNOSIS — G8929 Other chronic pain: Secondary | ICD-10-CM | POA: Diagnosis not present

## 2019-12-18 DIAGNOSIS — M25662 Stiffness of left knee, not elsewhere classified: Secondary | ICD-10-CM | POA: Diagnosis not present

## 2019-12-18 DIAGNOSIS — M25561 Pain in right knee: Secondary | ICD-10-CM | POA: Insufficient documentation

## 2019-12-18 DIAGNOSIS — M25562 Pain in left knee: Secondary | ICD-10-CM | POA: Diagnosis not present

## 2019-12-18 NOTE — Therapy (Signed)
[place], [place], [address] Phone: [phone]   Fax:  [phone]  Physical Therapy Treatment  Patient Details  Name: Theresa Duran MRN: [id number] Date of Birth: May 19, 1945 Referring Provider (PT): Zenia Resides, MD   Encounter Date: 12/18/2019   PT End of Session - 12/18/19 1416    Visit Number 3    Number of Visits 12    Date for PT Re-Evaluation 01/14/20    Authorization Type [employer] MCR/MCD    Progress Note Due on Visit 10    PT Start Time 1430    PT Stop Time 1515    PT Time Calculation (min) 45 min    Activity Tolerance Patient tolerated treatment well    Behavior During Therapy [facility] for tasks assessed/performed           Past Medical History:  Diagnosis Date  . Hyperlipidemia   . Personal history of colonic polyps-adenoma 05/14/2013    Past Surgical History:  Procedure Laterality Date  . ABDOMINAL HYSTERECTOMY     total vaginal with BSO, secondary to prolapse    There were no vitals filed for this visit.   Subjective Assessment - 12/18/19 1437    Subjective Pt states she didn't do her exercises and so she feels bad today. Pt states she last did them 2 days ago. Pt states that the left knee hurts the most (especially in the back)    Patient is accompained by: Interpreter   My Hulen Luster [phone]   Pertinent History HTN, HLD    Limitations Standing;House hold activities;Walking    How long can you stand comfortably? 15-20 minutes    How long can you walk comfortably? 1/2 mile    Currently in Pain? Yes    Pain Score 5     Pain Location Knee    Pain Orientation Left                             [facility] Adult PT Treatment/Exercise - 12/18/19 0001      Knee/Hip Exercises: Stretches   Passive Hamstring Stretch Both;2 reps;30 seconds    Hip Flexor Stretch Both;30 seconds      Knee/Hip Exercises: Aerobic   Nustep L4x 5 min LE & UE      Knee/Hip Exercises: Standing    Heel Raises Both;20 reps    Terminal Knee Extension Strengthening;Both;2 sets;10 reps    Theraband Level (Terminal Knee Extension) Level 3 (Green)    Hip Abduction Stengthening;Both;2 sets;10 reps;Knee straight    Abduction Limitations green tband    Hip Extension Stengthening;2 sets;10 reps    Extension Limitations green tband      Knee/Hip Exercises: Supine   Heel Slides AROM;Right;10 reps    Straight Leg Raises Strengthening;Both;2 sets;10 reps    Straight Leg Raises Limitations 2 lb      Knee/Hip Exercises: Prone   Other Prone Exercises quad set 2 x10 bilat      Manual Therapy   Manual Therapy Joint mobilization;Soft tissue mobilization    Joint Mobilization tibiofemoral flexion & extension grade II to III mobs    Soft tissue mobilization L hamstring TPR and STW                    PT Short Term Goals - 12/03/19 1529      PT SHORT TERM GOAL #1   Title Pt will be independent with initial HEP  Baseline Newly provided    Time 3    Period Weeks    Status New    Target Date 12/24/19      PT SHORT TERM GOAL #2   Title Pt will be able to tolerate standing at least 20 minutes for household tasks    Baseline Tolerates 10-15 minutes    Time 3    Period Weeks    Status New    Target Date 12/24/19      PT SHORT TERM GOAL #3   Title Pt will improve L knee AROM to at least 5 to 110 deg    Time 3    Period Weeks    Status New    Target Date 12/24/19             PT Long Term Goals - 12/03/19 1532      PT LONG TERM GOAL #1   Title Pt will be independent with advanced HEP    Time 6    Period Weeks    Status New    Target Date 01/14/20      PT LONG TERM GOAL #2   Title Pt will have improved bilat knee ROM to 0 to 120 degrees    Time 6    Period Weeks    Status New    Target Date 01/14/20      PT LONG TERM GOAL #3   Title Pt will increase knee and hip strength to at least 4+/5 for improved function    Time 6    Period Weeks    Status New    Target  Date 01/14/20      PT LONG TERM GOAL #4   Title Pt will have improved SLS to at least 20 sec bilaterally for decreased fall risk    Time 6    Period Weeks    Status New    Target Date 01/14/20      PT LONG TERM GOAL #5   Title Pt will have TUG score of at least 7.7 sec to be within her age norms for reduced fall risk    Time 6    Period Weeks    Status New    Target Date 01/14/20      PT LONG TERM GOAL #6   Title Vestibular LTG: pt will demonstrated independence with final vestibular/balance HEP    Baseline no HEP initiated due being unable to provoke dizziness, and no more complaints of dizziness via patient    Time 6    Period Weeks    Status Deferred      PT LONG TERM GOAL #7   Title Pt will report 0/5 dizziness with bed mobility, bending and turns    Baseline patient reports 0/5 dizziness with these activities    Time 6    Period Weeks    Status Achieved      PT LONG TERM GOAL #8   Title Pt will increase FOTO to >59% and will decrease DHI by 18 points    Baseline 34% FOTO; DHI 76 - severe; 10/18/19 - 90% FOTO, DHI 2    Time 6    Period Weeks    Status Achieved                 Plan - 12/18/19 1526    Clinical Impression Statement Pt c/o mostly L knee pain. Pain remains around posterior knee. R knee with improving ROM (less pain in flexion). Pt with tight L  hamstrings, addressed with manual therapy and stretching. Continued progression of quad strengthening and hip strengthening this session. Hip stretching initiated (pt with difficulty performing knee extension in standing without compensating at hips).    Personal Factors and Comorbidities Comorbidity 1;Transportation    Comorbidities HTN, arthritis    Examination-Activity Limitations Locomotion Level;Stairs;Stand;Transfers;Squat    Examination-Participation Restrictions Community Activity;Cleaning;Meal Prep    Stability/Clinical Decision Making Stable/Uncomplicated    Rehab Potential Good    PT Frequency 2x /  week    PT Duration 6 weeks    PT Treatment/Interventions ADLs/Self Care Home Management;Gait training;Functional mobility training;Therapeutic activities;Therapeutic exercise;Balance training;Neuromuscular re-education;Patient/family education;Aquatic Therapy;Electrical Stimulation;Cryotherapy;Iontophoresis 4mg /ml Dexamethasone;Ultrasound;Moist Heat;Stair training;Manual techniques;Passive range of motion;Dry needling;Taping;Vasopneumatic Device    PT Next Visit Plan Review HEP. Continue to progress knee ROM, quad, plantar flexor, and hip strengthening. Consider including hip stretches.    PT Home Exercise Plan Access Code: [email or web address]. SLR, quad set in prone, hamstring stretch bilat, self knee flexion stretch, standing hip strengthening    Consulted and Agree with Plan of Care Patient           Patient will benefit from skilled therapeutic intervention in order to improve the following deficits and impairments:  Decreased range of motion, Difficulty walking, Decreased endurance, Pain, Decreased balance, Hypomobility, Impaired flexibility, Decreased mobility, Decreased strength  Visit Diagnosis: Difficulty in walking, not elsewhere classified  Chronic pain of left knee  Chronic pain of right knee  Stiffness of left knee, not elsewhere classified  Muscle weakness (generalized)     Problem List Patient Active Problem List   Diagnosis Date Noted  . Other peripheral vertigo, unspecified ear 09/18/2019  . Screen for colon cancer 09/03/2019  . Chronic pain of both knees 09/03/2019  . Brittle nails 01/23/2019  . Hair loss 01/23/2019  . Screening for HIV (human immunodeficiency virus) 01/23/2019  . Nail fragility 01/08/2019  . Vertigo 09/13/2018  . Pain of upper abdomen 06/13/2017  . Pain in both feet 11/03/2016  . Leg swelling 11/03/2016  . Chest pain 01/05/2016  . Encounter for immunization 01/05/2016  . Osteoporosis 08/14/2015  . Cystocele 05/22/2015  . Urinary urgency  03/25/2015  . UTI (urinary tract infection) 02/01/2014  . Personal history of colonic polyps-adenoma 05/14/2013  . Essential hypertension, benign 01/14/2012  . Osteoarthritis of left knee 12/28/2011  . 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 Cough 11/09/2011  . Hyperlipidemia 08/18/2011  . Cataract 08/18/2011  . Low back pain 08/18/2011  . Muscle cramps at night 08/18/2011    [facility] April Ma L Hunter PT, DPT 12/18/2019, 3:29 PM  [facility] [address] [place], [place], [address] Phone: [phone]   Fax:  [phone]  Name: Jassica Zazueta MRN: [id number] Date of Birth: 1945/08/18

## 2019-12-24 ENCOUNTER — Encounter: Payer: Self-pay | Admitting: Physical Therapy

## 2019-12-24 ENCOUNTER — Ambulatory Visit: Payer: Medicare HMO | Admitting: Physical Therapy

## 2019-12-24 ENCOUNTER — Other Ambulatory Visit: Payer: Self-pay

## 2019-12-24 DIAGNOSIS — M25562 Pain in left knee: Secondary | ICD-10-CM | POA: Diagnosis not present

## 2019-12-24 DIAGNOSIS — G8929 Other chronic pain: Secondary | ICD-10-CM

## 2019-12-24 DIAGNOSIS — M25561 Pain in right knee: Secondary | ICD-10-CM | POA: Diagnosis not present

## 2019-12-24 DIAGNOSIS — M25662 Stiffness of left knee, not elsewhere classified: Secondary | ICD-10-CM | POA: Diagnosis not present

## 2019-12-24 DIAGNOSIS — R262 Difficulty in walking, not elsewhere classified: Secondary | ICD-10-CM

## 2019-12-24 DIAGNOSIS — M6281 Muscle weakness (generalized): Secondary | ICD-10-CM | POA: Diagnosis not present

## 2019-12-24 NOTE — Therapy (Signed)
[name], [place], [address] Phone: [phone]   Fax:  [phone]  Physical Therapy Treatment  Patient Details  Name: Theresa Duran MRN: [id number] Date of Birth: March 19, 1946 Referring Provider (PT): Zenia Resides, MD   Encounter Date: 12/24/2019   PT End of Session - 12/24/19 1311    Visit Number 4    Number of Visits 12    Date for PT Re-Evaluation 01/14/20    Authorization Type [employer] MCR/MCD    Progress Note Due on Visit 10    PT Start Time 1316    PT Stop Time 1405    PT Time Calculation (min) 49 min    Activity Tolerance Patient tolerated treatment well    Behavior During Therapy [facility] for tasks assessed/performed           Past Medical History:  Diagnosis Date  . Hyperlipidemia   . Personal history of colonic polyps-adenoma 05/14/2013    Past Surgical History:  Procedure Laterality Date  . ABDOMINAL HYSTERECTOMY     total vaginal with BSO, secondary to prolapse    There were no vitals filed for this visit.   Subjective Assessment - 12/24/19 1318    Subjective Pt reports standing for ~15-20 minutes she is still feeling pain. Pt states that when she does exercise there is no pain. Pt continues to report R knee pain in the front and L knee pain in the back. Pt notes no L knee pain when she's not standing on it.    Patient is accompained by: Interpreter   My Hulen Luster [phone]   Pertinent History HTN, HLD    Limitations Standing;House hold activities;Walking    How long can you stand comfortably? 15-20 minutes    How long can you walk comfortably? 1/2 mile    Currently in Pain? Yes    Pain Score 4     Pain Location Knee    Pain Orientation Left              [facility] PT Assessment - 12/24/19 0001      AROM   Right Knee Extension -5    Right Knee Flexion 125    Left Knee Extension -10    Left Knee Flexion 110                         [facility] Adult PT  Treatment/Exercise - 12/24/19 0001      Knee/Hip Exercises: Stretches   Passive Hamstring Stretch 30 seconds;Both    Hip Flexor Stretch Both;30 seconds    Hip Flexor Stretch Limitations Standing at counter      Knee/Hip Exercises: Aerobic   Nustep L6 x 5 min LE      Knee/Hip Exercises: Standing   Hip Abduction Stengthening;Both;2 sets;10 reps;Knee straight    Abduction Limitations green tband    Hip Extension Stengthening;Both;2 sets;10 reps    Extension Limitations green tband    Functional Squat 20 reps    Rocker Board 1 minute    Rocker Board Limitations side to side hands on counter, hands off counter; forward backward hands on counter, hands off counter    SLS On airex: x30 sec each with hands on counter; stable ground x 30 sec no hand hold    Other Standing Knee Exercises on airex: feet together eyes open x30 sec, eyes closed x 30 sec      Knee/Hip Exercises: Seated   Long [employer]  Strengthening;Both;10 reps    Long Arc Quad Limitations green tband    Hamstring Curl Strengthening;Both;10 reps    Hamstring Limitations green tband    Abduction/Adduction  Strengthening;Both;20 reps    Abd/Adduction Limitations green tband                    PT Short Term Goals - 12/24/19 1313      PT SHORT TERM GOAL #1   Title Pt will be independent with initial HEP    Baseline Newly provided    Time 3    Period Weeks    Status Achieved    Target Date 12/24/19      PT SHORT TERM GOAL #2   Title Pt will be able to tolerate standing at least 20 minutes for household tasks    Baseline Tolerates 10-15 minutes    Time 3    Period Weeks    Status Partially Met    Target Date 12/24/19      PT SHORT TERM GOAL #3   Title Pt will improve L knee AROM to at least 5 to 110 deg    Time 3    Period Weeks    Status Partially Met    Target Date 12/24/19             PT Long Term Goals - 12/03/19 1532      PT LONG TERM GOAL #1   Title Pt will be independent with advanced HEP     Time 6    Period Weeks    Status New    Target Date 01/14/20      PT LONG TERM GOAL #2   Title Pt will have improved bilat knee ROM to 0 to 120 degrees    Time 6    Period Weeks    Status New    Target Date 01/14/20      PT LONG TERM GOAL #3   Title Pt will increase knee and hip strength to at least 4+/5 for improved function    Time 6    Period Weeks    Status New    Target Date 01/14/20      PT LONG TERM GOAL #4   Title Pt will have improved SLS to at least 20 sec bilaterally for decreased fall risk    Time 6    Period Weeks    Status New    Target Date 01/14/20      PT LONG TERM GOAL #5   Title Pt will have TUG score of at least 7.7 sec to be within her age norms for reduced fall risk    Time 6    Period Weeks    Status New    Target Date 01/14/20      PT LONG TERM GOAL #6   Title Vestibular LTG: pt will demonstrated independence with final vestibular/balance HEP    Baseline no HEP initiated due being unable to provoke dizziness, and no more complaints of dizziness via patient    Time 6    Period Weeks    Status Deferred      PT LONG TERM GOAL #7   Title Pt will report 0/5 dizziness with bed mobility, bending and turns    Baseline patient reports 0/5 dizziness with these activities    Time 6    Period Weeks    Status Achieved      PT LONG TERM GOAL #8   Title Pt will increase FOTO  to >59% and will decrease DHI by 18 points    Baseline 34% FOTO; DHI 76 - severe; 10/18/19 - 90% FOTO, DHI 2    Time 6    Period Weeks    Status Achieved                 Plan - 12/24/19 1411    Clinical Impression Statement L knee ROM 10 to 110 deg. Hamstrings continued to be addressed. Treatment session focused on strengthening hip/knee in standing. Continued to progress pt's strengthening and standing tolerance. Initiated proprioceptive/balance exercises. Pt wants to amb greater distances with less pain. Pt has partially met her STG #2 and #3.    Personal Factors and  Comorbidities Comorbidity 1;Transportation    Comorbidities HTN, arthritis    Examination-Activity Limitations Locomotion Level;Stairs;Stand;Transfers;Squat    Examination-Participation Restrictions Community Activity;Cleaning;Meal Prep    Stability/Clinical Decision Making Stable/Uncomplicated    Rehab Potential Good    PT Frequency 2x / week    PT Duration 6 weeks    PT Treatment/Interventions ADLs/Self Care Home Management;Gait training;Functional mobility training;Therapeutic activities;Therapeutic exercise;Balance training;Neuromuscular re-education;Patient/family education;Aquatic Therapy;Electrical Stimulation;Cryotherapy;Iontophoresis 26m/ml Dexamethasone;Ultrasound;Moist Heat;Stair training;Manual techniques;Passive range of motion;Dry needling;Taping;Vasopneumatic Device    PT Next Visit Plan Review HEP. Continue to progress knee ROM, quad, plantar flexor, and hip strengthening.    PT Home Exercise Plan Access Code: [email or web address] SLR, quad set in prone, hamstring stretch bilat, self knee flexion stretch, standing hip strengthening    Consulted and Agree with Plan of Care Patient           Patient will benefit from skilled therapeutic intervention in order to improve the following deficits and impairments:  Decreased range of motion, Difficulty walking, Decreased endurance, Pain, Decreased balance, Hypomobility, Impaired flexibility, Decreased mobility, Decreased strength  Visit Diagnosis: Difficulty in walking, not elsewhere classified  Chronic pain of left knee  Chronic pain of right knee  Stiffness of left knee, not elsewhere classified  Muscle weakness (generalized)     Problem List Patient Active Problem List   Diagnosis Date Noted  . Other peripheral vertigo, unspecified ear 09/18/2019  . Screen for colon cancer 09/03/2019  . Chronic pain of both knees 09/03/2019  . Brittle nails 01/23/2019  . Hair loss 01/23/2019  . Screening for HIV (human immunodeficiency virus)  01/23/2019  . Nail fragility 01/08/2019  . Vertigo 09/13/2018  . Pain of upper abdomen 06/13/2017  . Pain in both feet 11/03/2016  . Leg swelling 11/03/2016  . Chest pain 01/05/2016  . Encounter for immunization 01/05/2016  . Osteoporosis 08/14/2015  . Cystocele 05/22/2015  . Urinary urgency 03/25/2015  . UTI (urinary tract infection) 02/01/2014  . Personal history of colonic polyps-adenoma 05/14/2013  . Essential hypertension, benign 01/14/2012  . Osteoarthritis of left knee 12/28/2011  . 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 Cough 11/09/2011  . Hyperlipidemia 08/18/2011  . Cataract 08/18/2011  . Low back pain 08/18/2011  . Muscle cramps at night 08/18/2011    GAscension St Mary'S HospitalApril Ma L NOakboroPT, DPT 12/24/2019, 2:19 PM  [facility] [address]Phone: [phone]  Fax:  [phone] Name: Theresa DotterMRN: [id number] of Birth: [birth year]

## 2019-12-31 ENCOUNTER — Ambulatory Visit: Payer: Medicare HMO | Admitting: Physical Therapy

## 2019-12-31 ENCOUNTER — Other Ambulatory Visit: Payer: Self-pay

## 2019-12-31 DIAGNOSIS — G8929 Other chronic pain: Secondary | ICD-10-CM | POA: Diagnosis not present

## 2019-12-31 DIAGNOSIS — M25662 Stiffness of left knee, not elsewhere classified: Secondary | ICD-10-CM

## 2019-12-31 DIAGNOSIS — M25561 Pain in right knee: Secondary | ICD-10-CM | POA: Diagnosis not present

## 2019-12-31 DIAGNOSIS — M6281 Muscle weakness (generalized): Secondary | ICD-10-CM | POA: Diagnosis not present

## 2019-12-31 DIAGNOSIS — M25562 Pain in left knee: Secondary | ICD-10-CM

## 2019-12-31 DIAGNOSIS — R262 Difficulty in walking, not elsewhere classified: Secondary | ICD-10-CM | POA: Diagnosis not present

## 2019-12-31 NOTE — Therapy (Signed)
[place], [place], [address] Phone: [phone]   Fax:  [phone]  Physical Therapy Treatment  Patient Details  Name: Theresa Duran MRN: [id number] Date of Birth: 01-Aug-1945 Referring Provider (PT): Zenia Resides, MD   Encounter Date: 12/31/2019   PT End of Session - 12/31/19 1449    Visit Number 5    Number of Visits 12    Date for PT Re-Evaluation 01/14/20    Authorization Type [employer] MCR/MCD    Progress Note Due on Visit 10    PT Start Time 1320    PT Stop Time 1350    PT Time Calculation (min) 30 min    Activity Tolerance Patient tolerated treatment well    Behavior During Therapy [facility] for tasks assessed/performed           Past Medical History:  Diagnosis Date  . Hyperlipidemia   . Personal history of colonic polyps-adenoma 05/14/2013    Past Surgical History:  Procedure Laterality Date  . ABDOMINAL HYSTERECTOMY     total vaginal with BSO, secondary to prolapse    There were no vitals filed for this visit.   Subjective Assessment - 12/31/19 1421    Subjective Pt states her knee is feeling better.    Patient is accompained by: Interpreter   My Hulen Luster [phone]   Pertinent History HTN, HLD    Limitations Standing;House hold activities;Walking    How long can you stand comfortably? 15-20 minutes    How long can you walk comfortably? 1/2 mile    Currently in Pain? Yes    Pain Score 3     Pain Location Knee    Pain Orientation Right;Left              [facility] PT Assessment - 12/31/19 0001      AROM   Right Knee Extension -3    Right Knee Flexion 133    Left Knee Extension -9    Left Knee Flexion 123                         [facility] Adult PT Treatment/Exercise - 12/31/19 0001      Knee/Hip Exercises: Stretches   Passive Hamstring Stretch 30 seconds;Both    Passive Hamstring Stretch Limitations with 10 quad sets    Hip Flexor Stretch Both;30 seconds       Knee/Hip Exercises: Aerobic   Nustep L6 x 4 min LE & UE      Knee/Hip Exercises: Supine   Straight Leg Raises Strengthening;Both;2 sets;10 reps    Straight Leg Raises Limitations 3 lb      Manual Therapy   Manual Therapy Joint mobilization;Soft tissue mobilization    Joint Mobilization tibiofemoral flexion & extension grade II to III mobs                    PT Short Term Goals - 12/24/19 1313      PT SHORT TERM GOAL #1   Title Pt will be independent with initial HEP    Baseline Newly provided    Time 3    Period Weeks    Status Achieved    Target Date 12/24/19      PT SHORT TERM GOAL #2   Title Pt will be able to tolerate standing at least 20 minutes for household tasks    Baseline Tolerates 10-15 minutes    Time 3    Period  Weeks    Status Partially Met    Target Date 12/24/19      PT SHORT TERM GOAL #3   Title Pt will improve L knee AROM to at least 5 to 110 deg    Time 3    Period Weeks    Status Partially Met    Target Date 12/24/19             PT Long Term Goals - 12/03/19 1532      PT LONG TERM GOAL #1   Title Pt will be independent with advanced HEP    Time 6    Period Weeks    Status New    Target Date 01/14/20      PT LONG TERM GOAL #2   Title Pt will have improved bilat knee ROM to 0 to 120 degrees    Time 6    Period Weeks    Status New    Target Date 01/14/20      PT LONG TERM GOAL #3   Title Pt will increase knee and hip strength to at least 4+/5 for improved function    Time 6    Period Weeks    Status New    Target Date 01/14/20      PT LONG TERM GOAL #4   Title Pt will have improved SLS to at least 20 sec bilaterally for decreased fall risk    Time 6    Period Weeks    Status New    Target Date 01/14/20      PT LONG TERM GOAL #5   Title Pt will have TUG score of at least 7.7 sec to be within her age norms for reduced fall risk    Time 6    Period Weeks    Status New    Target Date 01/14/20      PT LONG TERM GOAL  #6   Title Vestibular LTG: pt will demonstrated independence with final vestibular/balance HEP    Baseline no HEP initiated due being unable to provoke dizziness, and no more complaints of dizziness via patient    Time 6    Period Weeks    Status Deferred      PT LONG TERM GOAL #7   Title Pt will report 0/5 dizziness with bed mobility, bending and turns    Baseline patient reports 0/5 dizziness with these activities    Time 6    Period Weeks    Status Achieved      PT LONG TERM GOAL #8   Title Pt will increase FOTO to >59% and will decrease DHI by 18 points    Baseline 34% FOTO; DHI 76 - severe; 10/18/19 - 90% FOTO, DHI 2    Time 6    Period Weeks    Status Achieved                 Plan - 12/31/19 1452    Clinical Impression Statement Pt's ROM continues to improve. Treatment focused on continued improvements in ROM and strength. Pt's left knee mostly sore along insetion of biceps femoris.    Personal Factors and Comorbidities Comorbidity 1;Transportation    Comorbidities HTN, arthritis    Examination-Activity Limitations Locomotion Level;Stairs;Stand;Transfers;Squat    Examination-Participation Restrictions Community Activity;Cleaning;Meal Prep    Stability/Clinical Decision Making Stable/Uncomplicated    Rehab Potential Good    PT Frequency 2x / week    PT Duration 6 weeks    PT Treatment/Interventions ADLs/Self Care  Home Management;Gait training;Functional mobility training;Therapeutic activities;Therapeutic exercise;Balance training;Neuromuscular re-education;Patient/family education;Aquatic Therapy;Electrical Stimulation;Cryotherapy;Iontophoresis 36m/ml Dexamethasone;Ultrasound;Moist Heat;Stair training;Manual techniques;Passive range of motion;Dry needling;Taping;Vasopneumatic Device    PT Next Visit Plan Review HEP. Continue to progress knee ROM, quad, plantar flexor, and hip strengthening.    PT Home Exercise Plan Access Code: [email or web address] SLR, quad set in prone, hamstring  stretch bilat, self knee flexion stretch, standing hip strengthening    Consulted and Agree with Plan of Care Patient           Patient will benefit from skilled therapeutic intervention in order to improve the following deficits and impairments:  Decreased range of motion, Difficulty walking, Decreased endurance, Pain, Decreased balance, Hypomobility, Impaired flexibility, Decreased mobility, Decreased strength  Visit Diagnosis: Difficulty in walking, not elsewhere classified  Chronic pain of left knee  Chronic pain of right knee  Stiffness of left knee, not elsewhere classified  Muscle weakness (generalized)     Problem List Patient Active Problem List   Diagnosis Date Noted  . Other peripheral vertigo, unspecified ear 09/18/2019  . Screen for colon cancer 09/03/2019  . Chronic pain of both knees 09/03/2019  . Brittle nails 01/23/2019  . Hair loss 01/23/2019  . Screening for HIV (human immunodeficiency virus) 01/23/2019  . Nail fragility 01/08/2019  . Vertigo 09/13/2018  . Pain of upper abdomen 06/13/2017  . Pain in both feet 11/03/2016  . Leg swelling 11/03/2016  . Chest pain 01/05/2016  . Encounter for immunization 01/05/2016  . Osteoporosis 08/14/2015  . Cystocele 05/22/2015  . Urinary urgency 03/25/2015  . UTI (urinary tract infection) 02/01/2014  . Personal history of colonic polyps-adenoma 05/14/2013  . Essential hypertension, benign 01/14/2012  . Osteoarthritis of left knee 12/28/2011  . 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 Cough 11/09/2011  . Hyperlipidemia 08/18/2011  . Cataract 08/18/2011  . Low back pain 08/18/2011  . Muscle cramps at night 08/18/2011    G[facility] Cane CreekA7526 Argyle StreetNDewartPT, DPT 12/31/2019, 3:02 PM  [facility]137 [address]G[place] [address]Phone: [phone]  Fax:  [phone] Name: Theresa SansonMRN: [id number] of Birth: 207-01-1946

## 2020-01-10 ENCOUNTER — Other Ambulatory Visit: Payer: Self-pay

## 2020-01-10 ENCOUNTER — Ambulatory Visit: Payer: Medicare HMO | Admitting: Physical Therapy

## 2020-01-10 ENCOUNTER — Encounter: Payer: Self-pay | Admitting: Physical Therapy

## 2020-01-10 DIAGNOSIS — M25662 Stiffness of left knee, not elsewhere classified: Secondary | ICD-10-CM | POA: Diagnosis not present

## 2020-01-10 DIAGNOSIS — M25562 Pain in left knee: Secondary | ICD-10-CM | POA: Diagnosis not present

## 2020-01-10 DIAGNOSIS — G8929 Other chronic pain: Secondary | ICD-10-CM

## 2020-01-10 DIAGNOSIS — M6281 Muscle weakness (generalized): Secondary | ICD-10-CM | POA: Diagnosis not present

## 2020-01-10 DIAGNOSIS — M25561 Pain in right knee: Secondary | ICD-10-CM | POA: Diagnosis not present

## 2020-01-10 DIAGNOSIS — R262 Difficulty in walking, not elsewhere classified: Secondary | ICD-10-CM

## 2020-01-10 NOTE — Therapy (Addendum)
[place] Dur[place], [place], [address] Phone: [phone]   Fax:  [phone]  Physical Therapy Treatment  Patient Details  Name: Theresa Duran MRN: [id number] Date of Birth: 01-16-1946 Referring Provider (PT): Theresa Resides, MD   Encounter Date: 01/10/2020   PT End of Session - 01/10/20 1056    Visit Number 6    Number of Visits 12    Date for PT Re-Evaluation 02/07/20    Authorization Type [employer] MCR/MCD    Progress Note Due on Visit 10    PT Start Time 1047    PT Stop Time 1130    PT Time Calculation (min) 43 min    Activity Tolerance Patient tolerated treatment well    Behavior During Therapy [facility] for tasks assessed/performed           Past Medical History:  Diagnosis Date  . Hyperlipidemia   . Personal history of colonic polyps-adenoma 05/14/2013    Past Surgical History:  Procedure Laterality Date  . ABDOMINAL HYSTERECTOMY     total vaginal with BSO, secondary to prolapse    There were no vitals filed for this visit.   Subjective Assessment - 01/10/20 1055    Subjective No pain today , L knee was hard to bend but nowit is better.    Patient is accompained by: Interpreter    Patient Stated Goals To improve knee pain    Currently in Pain? No/denies              [facility] PT Assessment - 01/10/20 0001      AROM   Right Knee Extension -5    Right Knee Flexion 132    Left Knee Extension -10    Left Knee Flexion 132                         [facility] Adult PT Treatment/Exercise - 01/10/20 0001      Knee/Hip Exercises: Standing   Hip Abduction Both    Abduction Limitations x 15     Hip Extension Both    Extension Limitations x 15     Functional Squat 1 set;15 reps      Knee/Hip Exercises: Seated   Sit to Sand 1 set;15 reps;without UE support      Manual Therapy   Soft tissue mobilization L biceps femoris in prone IASTM                   PT Education - 01/10/20  1503    Education Details stretching, POC    Person(s) Educated Patient    Methods Explanation    Comprehension Verbalized understanding            PT Short Term Goals - 12/24/19 1313      PT SHORT TERM GOAL #1   Title Pt will be independent with initial HEP    Baseline Newly provided    Time 3    Period Weeks    Status Achieved    Target Date 12/24/19      PT SHORT TERM GOAL #2   Title Pt will be able to tolerate standing at least 20 minutes for household tasks    Baseline Tolerates 10-15 minutes    Time 3    Period Weeks    Status Partially Met    Target Date 12/24/19      PT SHORT TERM GOAL #3   Title Pt will improve L  knee AROM to at least 5 to 110 deg    Time 3    Period Weeks    Status Partially Met    Target Date 12/24/19             PT Long Term Goals - 01/10/20 1057      PT LONG TERM GOAL #1   Title Pt will be independent with advanced HEP    Status On-going      PT LONG TERM GOAL #2   Title Pt will have improved bilat knee ROM to 0 to 120 degrees    Baseline Rt 0-10-130, Lt. 0-12-130 (pain end range )    Status On-going      PT LONG TERM GOAL #3   Title Pt will increase knee and hip strength to at least 4+/5 for improved function    Status On-going      PT LONG TERM GOAL #4   Title Pt will have improved SLS to at least 20 sec bilaterally for decreased fall risk    Status On-going      PT LONG TERM GOAL #5   Title Pt will have TUG score of at least 7.7 sec to be within her age norms for reduced fall risk    Status Unable to assess                 Plan - 01/10/20 1105    Clinical Impression Statement Patient has improved functionally and can stand longer in her home for ADLs.  She has pain in L post knee with knee flexion, difficulty crossing her LLE over Rt.  She will benefit from PT to improve her hip strength and mobility.    PT Treatment/Interventions ADLs/Self Care Home Management;Gait training;Functional mobility  training;Therapeutic activities;Therapeutic exercise;Balance training;Neuromuscular re-education;Patient/family education;Aquatic Therapy;Electrical Stimulation;Cryotherapy;Iontophoresis 42m/ml Dexamethasone;Ultrasound;Moist Heat;Stair training;Manual techniques;Passive range of motion;Dry needling;Taping;Vasopneumatic Device    PT Next Visit Plan cont closed chain ex, L post knee manual/stretching  and hip strength    PT Home Exercise Plan Access Code: [email or web address] SLR, quad set in prone, hamstring stretch bilat, self knee flexion stretch, standing hip strengthening           Patient will benefit from skilled therapeutic intervention in order to improve the following deficits and impairments:  Decreased range of motion, Difficulty walking, Decreased endurance, Pain, Decreased balance, Hypomobility, Impaired flexibility, Decreased mobility, Decreased strength  Visit Diagnosis: Difficulty in walking, not elsewhere classified  Chronic pain of left knee  Chronic pain of right knee  Stiffness of left knee, not elsewhere classified  Muscle weakness (generalized)     Problem List Patient Active Problem List   Diagnosis Date Noted  . Other peripheral vertigo, unspecified ear 09/18/2019  . Screen for colon cancer 09/03/2019  . Chronic pain of both knees 09/03/2019  . Brittle nails 01/23/2019  . Hair loss 01/23/2019  . Screening for HIV (human immunodeficiency virus) 01/23/2019  . Nail fragility 01/08/2019  . Vertigo 09/13/2018  . Pain of upper abdomen 06/13/2017  . Pain in both feet 11/03/2016  . Leg swelling 11/03/2016  . Chest pain 01/05/2016  . Encounter for immunization 01/05/2016  . Osteoporosis 08/14/2015  . Cystocele 05/22/2015  . Urinary urgency 03/25/2015  . UTI (urinary tract infection) 02/01/2014  . Personal history of colonic polyps-adenoma 05/14/2013  . Essential hypertension, benign 01/14/2012  . Osteoarthritis of left knee 12/28/2011  . 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  Cough 11/09/2011  . Hyperlipidemia 08/18/2011  . Cataract 08/18/2011  . Low  back pain 08/18/2011  . Muscle cramps at night 08/18/2011    Theresa Duran 01/10/2020, 3:07 PM  [facility] [address] [place], [place], [address] Phone: [phone]   Fax:  [phone]  Name: Theresa Duran MRN: [id number] Date of Birth: [birth year]   Theresa Duran, PT 01/10/20 3:07 PM Phone: [phone] Fax: [phone]   What was this (referring dx) caused by? []  Surgery []  Fall [x]  Ongoing issue [x]  Arthritis []  Other: ____________  Laterality: []  Rt []  Lt [x]  Both  Check all possible CPT codes:      [x]  97110 (Therapeutic Exercise)  []  92507 (SLP Treatment)  []  97112 (Neuro Re-ed)   []  92526 (Swallowing Treatment)   [x]  97116 (Gait Training)   []  [id number] (Cognitive Training, 1st 15 minutes) [x]  97140 (Manual Therapy)   []  97130 (Cognitive Training, each add'l 15 minutes)  [x]  97530 (Therapeutic Activities)  []  Other, List CPT Code ____________    [x]  53967 (Self Care)       []  All codes above (97110 - 97535)  []  97012 (Mechanical Traction)  [x]  97014 (E-stim Unattended)  []  97032 (E-stim manual)  []  97033 (Ionto)  [x]  97035 (Ultrasound)  []  97760 (Orthotic Fit) []  97750 (Physical Performance Training) []  [id number] (Aquatic Therapy) []  97034 (Contrast Bath) []  [id number] (Paraffin) []  97597 (Wound Care 1st 20 sq cm) []  97598 (Wound Care each add'l 20 sq cm)  Theresa Duran, PT 01/11/20 8:24 AM Phone: [phone] Fax: [phone]

## 2020-01-29 ENCOUNTER — Other Ambulatory Visit: Payer: Self-pay

## 2020-01-29 ENCOUNTER — Ambulatory Visit: Payer: Medicare HMO | Attending: Family Medicine | Admitting: Physical Therapy

## 2020-01-29 ENCOUNTER — Encounter: Payer: Self-pay | Admitting: Physical Therapy

## 2020-01-29 DIAGNOSIS — M25662 Stiffness of left knee, not elsewhere classified: Secondary | ICD-10-CM | POA: Diagnosis not present

## 2020-01-29 DIAGNOSIS — R262 Difficulty in walking, not elsewhere classified: Secondary | ICD-10-CM | POA: Insufficient documentation

## 2020-01-29 DIAGNOSIS — M25562 Pain in left knee: Secondary | ICD-10-CM | POA: Insufficient documentation

## 2020-01-29 DIAGNOSIS — M25561 Pain in right knee: Secondary | ICD-10-CM | POA: Insufficient documentation

## 2020-01-29 DIAGNOSIS — G8929 Other chronic pain: Secondary | ICD-10-CM | POA: Diagnosis not present

## 2020-01-29 DIAGNOSIS — M6281 Muscle weakness (generalized): Secondary | ICD-10-CM | POA: Diagnosis not present

## 2020-01-29 NOTE — Therapy (Signed)
[facility] [address] [place], [place], [address] Phone: [phone]   Fax:  [phone]  Physical Therapy Treatment/ERO  Patient Details  Name: Theresa Duran MRN: [id number] Date of Birth: 10/27/1945 Referring Provider (PT): Hensel, William A, MD   Encounter Date: 01/29/2020   PT End of Session - 01/29/20 1548    Visit Number 7    Number of Visits 13    Date for PT Re-Evaluation 03/11/20    Authorization Type [employer] MCR/MCD    Progress Note Due on Visit 10    PT Start Time 1545    PT Stop Time 1630    PT Time Calculation (min) 45 min    Activity Tolerance Patient tolerated treatment well    Behavior During Therapy WFL for tasks assessed/performed           Past Medical History:  Diagnosis Date  . Hyperlipidemia   . Personal history of colonic polyps-adenoma 05/14/2013    Past Surgical History:  Procedure Laterality Date  . ABDOMINAL HYSTERECTOMY     total vaginal with BSO, secondary to prolapse    There were no vitals filed for this visit.   Subjective Assessment - 01/29/20 1548    Subjective Pt enters clinic  without AD  she feels she is doing well but only needs a little bit more help    Patient is accompained by: Interpreter   Tina N  [place]   Pertinent History HTN, HLD    Limitations Standing;House hold activities;Walking    How long can you stand comfortably? up to 60 minutes cooking    How long can you walk comfortably? Pt reports walking for about 30 minutees    Patient Stated Goals To improve knee pain    Currently in Pain? No/denies    Pain Score 3     Pain Location Knee    Pain Orientation Right;Left    Pain Descriptors / Indicators Aching    Pain Type Chronic pain    Pain Onset More than a month ago    Pain Frequency Intermittent              [facility] PT Assessment - 01/29/20 0001      Assessment   Medical Diagnosis M25.561,M25.562,G89.29 (ICD-10-CM) - Chronic pain of both knees     Referring Provider (PT) Hensel, William A, MD      Functional Tests   Functional tests Sit to Stand;Single leg stance      Single Leg Stance   Comments RT   7.40   LT 5.2 sec      Sit to Stand   Comments 5 x STS  15.54      AROM   Overall AROM Comments standing hip AROM flex RT 80 and LT 90 in standing    Right Knee Extension -3    Right Knee Flexion 135    Left Knee Extension -7    Left Knee Flexion 135      Strength   Right Hip Flexion 4+/5    Right Hip Extension 4/5    Right Hip External Rotation  3+/5;4-/5    Right Hip Internal Rotation 4/5    Right Hip ABduction 4/5    Left Hip Flexion 4+/5    Left Hip Extension 4-/5    Left Hip External Rotation 4-/5    Left Hip Internal Rotation 4/5    Left Hip ABduction 4-/5    Right Knee Flexion 4+/5    Right   Knee Extension 4+/5    Left Knee Flexion 4/5   no pain noted   Left Knee Extension 4/5      Flexibility   Hamstrings 110 on R, 120 on L      Timed Up and Go Test   Normal TUG (seconds) 9.59                                   PT Short Term Goals - 12/24/19 1313      PT SHORT TERM GOAL #1   Title Pt will be independent with initial HEP    Baseline Newly provided    Time 3    Period Weeks    Status Achieved    Target Date 12/24/19      PT SHORT TERM GOAL #2   Title Pt will be able to tolerate standing at least 20 minutes for household tasks    Baseline Tolerates 10-15 minutes    Time 3    Period Weeks    Status Partially Met    Target Date 12/24/19      PT SHORT TERM GOAL #3   Title Pt will improve L knee AROM to at least 5 to 110 deg    Time 3    Period Weeks    Status Partially Met    Target Date 12/24/19             PT Long Term Goals - 01/29/20 1551      PT LONG TERM GOAL #1   Title Pt will be independent with advanced HEP    Baseline working on advancing the HEP    Time 6    Period Weeks    Status On-going    Target Date 03/11/20      PT LONG TERM GOAL #2    Title Pt will have improved bilat knee ROM to 0 to 120 degrees    Baseline RT KNee ext/flex  -7 to 135, LT knee ext/flex -3 to 135    Time 6    Period Weeks    Status Achieved      PT LONG TERM GOAL #3   Title Pt will increase knee and hip strength to at least 4+/5 for improved function    Baseline Pt now iwht 4-/5 to 4+/5 in LE'sSee flowchart    Time 6    Period Weeks    Status On-going      PT LONG TERM GOAL #4   Title Pt will have improved SLS to at least 20 sec bilaterally for decreased fall risk    Baseline antalgic gait slight   RT7.4 sec. LT 5.2 sec    Time 6    Period Weeks    Status On-going    Target Date 03/11/20      PT LONG TERM GOAL #5   Title Pt will have TUG score of at least 7.7 sec to be within her age norms for reduced fall risk    Baseline 9.59 sec    Time 6    Period Weeks    Status On-going    Target Date 03/11/20              Access Code: [email or web address]: [email or web address]: 10/19/2021Prepared by: Donnetta Simpers BeardsleyExercises  Squat with Counter Support - 1 x daily - 7 x weekly - 3 sets - 10 reps  Supine Bridge - 1 x daily - 7 x weekly -  3 sets - 10 reps  Wall Squat - 1 x daily - 7 x weekly - 1 sets - 10 reps - 15 sec hold  Goblet Squat with Kettlebell - 1 x daily - 7 x weekly - 3 sets - 10 reps     Plan - 01/29/20 1646    Clinical Impression Statement Ms Gelin presents with interpreter Tina N for ERO   Pt has made good progress with KNee flex bil 135 degrees with LT ext -7 and RT ext -3.  Pt reports that she feels stronger but sometimes has 3/10 pain in knees.  Pt TUG score at 9.59 and would need to improve to 7.7 for age normal for decreased risk of fall.  Pt also unable to stand SLS on RT longer than 7,4 sec and LT 5.2 sec. 5 x STS 15.53 sec  ( normal 13 sec or less for decreased risk of fall/LE strength).  Pt will benefit from skilled PT extension of 6 more visits to maximize strength and increase functional mobility .    Personal  Factors and Comorbidities Comorbidity 1;Transportation    Comorbidities HTN, arthritis    Examination-Activity Limitations Locomotion Level;Stairs;Stand;Transfers;Squat    Examination-Participation Restrictions Community Activity;Cleaning;Meal Prep    Stability/Clinical Decision Making Stable/Uncomplicated    Clinical Decision Making Low    Rehab Potential Good    PT Frequency 1x / week    PT Duration 6 weeks    PT Treatment/Interventions ADLs/Self Care Home Management;Gait training;Functional mobility training;Therapeutic activities;Therapeutic exercise;Balance training;Neuromuscular re-education;Patient/family education;Aquatic Therapy;Electrical Stimulation;Cryotherapy;Iontophoresis 4mg/ml Dexamethasone;Ultrasound;Moist Heat;Stair training;Manual techniques;Passive range of motion;Dry needling;Taping;Vasopneumatic Device    PT Next Visit Plan cont closed chain ex, L post knee manual/stretching  and hip strength    PT Home Exercise Plan Access Code: [email or web address]. SLR, quad set in prone, hamstring stretch bilat, self knee flexion stretch, standing hip strengthening closed chain [email or web address]    Consulted and Agree with Plan of Care Patient           Patient will benefit from skilled therapeutic intervention in order to improve the following deficits and impairments:  Decreased range of motion, Difficulty walking, Decreased endurance, Pain, Decreased balance, Hypomobility, Impaired flexibility, Decreased mobility, Decreased strength  Visit Diagnosis: Difficulty in walking, not elsewhere classified  Chronic pain of left knee  Chronic pain of right knee  Stiffness of left knee, not elsewhere classified  Muscle weakness (generalized)     Problem List Patient Active Problem List   Diagnosis Date Noted  . Other peripheral vertigo, unspecified ear 09/18/2019  . Screen for colon cancer 09/03/2019  . Chronic pain of both knees 09/03/2019  . Brittle nails 01/23/2019  . Hair loss 01/23/2019  .  Screening for HIV (human immunodeficiency virus) 01/23/2019  . Nail fragility 01/08/2019  . Vertigo 09/13/2018  . Pain of upper abdomen 06/13/2017  . Pain in both feet 11/03/2016  . Leg swelling 11/03/2016  . Chest pain 01/05/2016  . Encounter for immunization 01/05/2016  . Osteoporosis 08/14/2015  . Cystocele 05/22/2015  . Urinary urgency 03/25/2015  . UTI (urinary tract infection) 02/01/2014  . Personal history of colonic polyps-adenoma 05/14/2013  . Essential hypertension, benign 01/14/2012  . Osteoarthritis of left knee 12/28/2011  . 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 Cough 11/09/2011  . Hyperlipidemia 08/18/2011  . Cataract 08/18/2011  . Low back pain 08/18/2011  . Muscle cramps at night 08/18/2011    Lawrie Beardsley, PT, ATRIC Certified Exercise Expert for the Aging Adult  01/29/20 4:56 PM Phone: [phone] Fax: [phone]    [facility] [address] [place], [place], [address] Phone: [phone]   Fax:  [phone]  Name: Theresa Duran MRN: [id number] Date of Birth: 12/17/1945   Check all possible CPT codes:      [] 97110 (Therapeutic Exercise)  [] 92507 (SLP Treatment)  [] 97112 (Neuro Re-ed)   [] 92526 (Swallowing Treatment)   [] 97116 (Gait Training)   [] 97129 (Cognitive Training, 1st 15 minutes) [] 97140 (Manual Therapy)   [] 97130 (Cognitive Training, each add'l 15 minutes)  [] 97530 (Therapeutic Activities)  [] Other, List CPT Code ____________    [] 97535 (Self Care)       [x] All codes above (97110 - 97535)  [] 97012 (Mechanical Traction)  [] 97014 (E-stim Unattended)  [] 97032 (E-stim manual)  [] 97033 (Ionto)  [] 97035 (Ultrasound)  [] 97016 (Vaso)  [] 97760 (Orthotic Fit) [] 97761 (Prosthetic Training) [] 97750 (Physical Performance Training) [] 97113 (Aquatic Therapy) [] 95992 (Canalith Repositioning) [] 97034 (Contrast Bath) [] 97018 (Paraffin) [] 97597 (Wound Care 1st 20 sq cm) [] 97598  (Wound Care each add'l 20 sq cm)      

## 2020-01-29 NOTE — Patient Instructions (Signed)
   Voncille Lo, PT, [place] Certified Exercise Expert for the Aging Adult  01/29/20 4:25 PM Phone: [phone] Fax: [phone]

## 2020-01-31 ENCOUNTER — Ambulatory Visit: Payer: Medicare HMO

## 2020-01-31 ENCOUNTER — Other Ambulatory Visit: Payer: Self-pay

## 2020-01-31 DIAGNOSIS — R262 Difficulty in walking, not elsewhere classified: Secondary | ICD-10-CM | POA: Diagnosis not present

## 2020-01-31 DIAGNOSIS — G8929 Other chronic pain: Secondary | ICD-10-CM

## 2020-01-31 DIAGNOSIS — M6281 Muscle weakness (generalized): Secondary | ICD-10-CM | POA: Diagnosis not present

## 2020-01-31 DIAGNOSIS — M25562 Pain in left knee: Secondary | ICD-10-CM

## 2020-01-31 DIAGNOSIS — M25561 Pain in right knee: Secondary | ICD-10-CM | POA: Diagnosis not present

## 2020-01-31 DIAGNOSIS — M25662 Stiffness of left knee, not elsewhere classified: Secondary | ICD-10-CM

## 2020-01-31 NOTE — Therapy (Signed)
[place], [place], [address] Phone: [phone]   Fax:  [phone]  Physical Therapy Treatment  Patient Details  Name: Theresa Duran MRN: [id number] Date of Birth: May 11, 1945 Referring Provider (PT): Zenia Resides, MD   Encounter Date: 01/31/2020   PT End of Session - 01/31/20 1555    Visit Number 8    Number of Visits 13    Date for PT Re-Evaluation 03/11/20    Authorization Type [employer] MCR/MCD    Progress Note Due on Visit 10    PT Start Time 1505    PT Stop Time 1545    PT Time Calculation (min) 40 min    Activity Tolerance Patient tolerated treatment well    Behavior During Therapy [facility] for tasks assessed/performed           Past Medical History:  Diagnosis Date  . Hyperlipidemia   . Personal history of colonic polyps-adenoma 05/14/2013    Past Surgical History:  Procedure Laterality Date  . ABDOMINAL HYSTERECTOMY     total vaginal with BSO, secondary to prolapse    There were no vitals filed for this visit.   Subjective Assessment - 01/31/20 1507    Subjective Pt reports she feels a lot better now and will cx next week if she feels good.    Patient is accompained by: Interpreter   Demetrios Isaacs cone   Pertinent History HTN, HLD    Limitations Standing;House hold activities;Walking    How long can you stand comfortably? up to 60 minutes cooking    How long can you walk comfortably? Pt reports walking for about 30 minutees    Patient Stated Goals To improve knee pain    Pain Score 2     Pain Location Knee    Pain Orientation Left    Pain Descriptors / Indicators Aching    Pain Onset More than a month ago                             [facility] Adult PT Treatment/Exercise - 01/31/20 0001      Knee/Hip Exercises: Standing   Hip Abduction Both    Abduction Limitations x 15     Hip Extension Both    Extension Limitations x 15     Functional Squat 1 set;15 reps       Knee/Hip Exercises: Seated   Sit to Sand 1 set;15 reps;without UE support      Manual Therapy   Manual Therapy Joint mobilization;Soft tissue mobilization;Passive ROM    Joint Mobilization tibiofemoral flexion & extension grade II to III mobs, end range flexion tib-fem mobs grade 3-4, inferior patellar glides grade 3    Soft tissue mobilization distal HS, vastus lateralis    Passive ROM flexion                    PT Short Term Goals - 12/24/19 1313      PT SHORT TERM GOAL #1   Title Pt will be independent with initial HEP    Baseline Newly provided    Time 3    Period Weeks    Status Achieved    Target Date 12/24/19      PT SHORT TERM GOAL #2   Title Pt will be able to tolerate standing at least 20 minutes for household tasks    Baseline Tolerates 10-15 minutes  Time 3    Period Weeks    Status Partially Met    Target Date 12/24/19      PT SHORT TERM GOAL #3   Title Pt will improve L knee AROM to at least 5 to 110 deg    Time 3    Period Weeks    Status Partially Met    Target Date 12/24/19             PT Long Term Goals - 01/29/20 1551      PT LONG TERM GOAL #1   Title Pt will be independent with advanced HEP    Baseline working on advancing the HEP    Time 6    Period Weeks    Status On-going    Target Date 03/11/20      PT LONG TERM GOAL #2   Title Pt will have improved bilat knee ROM to 0 to 120 degrees    Baseline RT KNee ext/flex  -7 to 135, LT knee ext/flex -3 to 135    Time 6    Period Weeks    Status Achieved      PT LONG TERM GOAL #3   Title Pt will increase knee and hip strength to at least 4+/5 for improved function    Baseline Pt now iwht 4-/5 to 4+/5 in LE'sSee flowchart    Time 6    Period Weeks    Status On-going      PT LONG TERM GOAL #4   Title Pt will have improved SLS to at least 20 sec bilaterally for decreased fall risk    Baseline antalgic gait slight   RT7.4 sec. LT 5.2 sec    Time 6    Period Weeks    Status  On-going    Target Date 03/11/20      PT LONG TERM GOAL #5   Title Pt will have TUG score of at least 7.7 sec to be within her age norms for reduced fall risk    Baseline 9.59 sec    Time 6    Period Weeks    Status On-going    Target Date 03/11/20                 Plan - 01/31/20 1556    Clinical Impression Statement Pt presents with interpeter and report of feeling better. She plans to cx remaining appts if she feels better. She responded well to therapy today, increasing knee ROM 130-135 with manual therapy, particularly following fibular mobs for improved knee flexion. Moderate cues provided to for decr anterior knee translation in squatting and STS to increase glute activation and decrease stress on knees.    Personal Factors and Comorbidities Comorbidity 1;Transportation    Comorbidities HTN, arthritis    Examination-Activity Limitations Locomotion Level;Stairs;Stand;Transfers;Squat    Examination-Participation Restrictions Community Activity;Cleaning;Meal Prep    Stability/Clinical Decision Making Stable/Uncomplicated    Rehab Potential Good    PT Frequency 1x / week    PT Duration 6 weeks    PT Treatment/Interventions ADLs/Self Care Home Management;Gait training;Functional mobility training;Therapeutic activities;Therapeutic exercise;Balance training;Neuromuscular re-education;Patient/family education;Aquatic Therapy;Electrical Stimulation;Cryotherapy;Iontophoresis 28m/ml Dexamethasone;Ultrasound;Moist Heat;Stair training;Manual techniques;Passive range of motion;Dry needling;Taping;Vasopneumatic Device    PT Next Visit Plan cont closed chain ex, L post knee manual/stretching  and hip strength    PT Home Exercise Plan Access Code: [email or web address] SLR, quad set in prone, hamstring stretch bilat, self knee flexion stretch, standing hip strengthening closed chain [email or web address]   Consulted and Agree  with Plan of Care Patient           Patient will benefit from skilled therapeutic  intervention in order to improve the following deficits and impairments:  Decreased range of motion, Difficulty walking, Decreased endurance, Pain, Decreased balance, Hypomobility, Impaired flexibility, Decreased mobility, Decreased strength  Visit Diagnosis: Difficulty in walking, not elsewhere classified  Chronic pain of left knee  Chronic pain of right knee  Stiffness of left knee, not elsewhere classified  Muscle weakness (generalized)     Problem List Patient Active Problem List   Diagnosis Date Noted  . Other peripheral vertigo, unspecified ear 09/18/2019  . Screen for colon cancer 09/03/2019  . Chronic pain of both knees 09/03/2019  . Brittle nails 01/23/2019  . Hair loss 01/23/2019  . Screening for HIV (human immunodeficiency virus) 01/23/2019  . Nail fragility 01/08/2019  . Vertigo 09/13/2018  . Pain of upper abdomen 06/13/2017  . Pain in both feet 11/03/2016  . Leg swelling 11/03/2016  . Chest pain 01/05/2016  . Encounter for immunization 01/05/2016  . Osteoporosis 08/14/2015  . Cystocele 05/22/2015  . Urinary urgency 03/25/2015  . UTI (urinary tract infection) 02/01/2014  . Personal history of colonic polyps-adenoma 05/14/2013  . Essential hypertension, benign 01/14/2012  . Osteoarthritis of left knee 12/28/2011  . 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 Cough 11/09/2011  . Hyperlipidemia 08/18/2011  . Cataract 08/18/2011  . Low back pain 08/18/2011  . Muscle cramps at night 08/18/2011    Izell Barrera , PT, DPT 01/31/2020, 3:58 PM  [facility] [address] [place], [place], [address] Phone: [phone]   Fax:  [phone]  Name: Evanie Buckle MRN: [id number] Date of Birth: 18-Jun-1945

## 2020-02-04 DIAGNOSIS — H442D3 Degenerative myopia with foveoschisis, bilateral eye: Secondary | ICD-10-CM | POA: Diagnosis not present

## 2020-02-04 DIAGNOSIS — H35343 Macular cyst, hole, or pseudohole, bilateral: Secondary | ICD-10-CM | POA: Diagnosis not present

## 2020-02-07 ENCOUNTER — Ambulatory Visit: Payer: Medicare HMO

## 2020-02-07 ENCOUNTER — Other Ambulatory Visit: Payer: Self-pay

## 2020-02-07 DIAGNOSIS — M6281 Muscle weakness (generalized): Secondary | ICD-10-CM

## 2020-02-07 DIAGNOSIS — G8929 Other chronic pain: Secondary | ICD-10-CM | POA: Diagnosis not present

## 2020-02-07 DIAGNOSIS — M25561 Pain in right knee: Secondary | ICD-10-CM | POA: Diagnosis not present

## 2020-02-07 DIAGNOSIS — M25662 Stiffness of left knee, not elsewhere classified: Secondary | ICD-10-CM | POA: Diagnosis not present

## 2020-02-07 DIAGNOSIS — M25562 Pain in left knee: Secondary | ICD-10-CM | POA: Diagnosis not present

## 2020-02-07 DIAGNOSIS — R262 Difficulty in walking, not elsewhere classified: Secondary | ICD-10-CM

## 2020-02-07 NOTE — Therapy (Addendum)
[place], [place], [address] Phone: [phone]   Fax:  [phone]  Physical Therapy Treatment/Discharge Note  Patient Details  Name: Theresa Duran MRN: [id number] Date of Birth: [birth year] Referring Provider (PT): Zenia Resides, MD   Encounter Date: 02/07/2020   PT End of Session - 02/07/20 1501    Visit Number 9    Number of Visits 13    Date for PT Re-Evaluation 03/11/20    Authorization Type [employer] MCR/MCD    Progress Note Due on Visit 10    PT Start Time 1500    PT Stop Time 1545    PT Time Calculation (min) 45 min    Activity Tolerance Patient tolerated treatment well    Behavior During Therapy [facility] for tasks assessed/performed           Past Medical History:  Diagnosis Date  . Hyperlipidemia   . Personal history of colonic polyps-adenoma 05/14/2013    Past Surgical History:  Procedure Laterality Date  . ABDOMINAL HYSTERECTOMY     total vaginal with BSO, secondary to prolapse    There were no vitals filed for this visit.   Subjective Assessment - 02/07/20 1500    Subjective Pt reports being ready to DC with HEP.    Patient is accompained by: Interpreter   Demetrios Isaacs cone   Pertinent History HTN, HLD    Limitations Standing;House hold activities;Walking    How long can you stand comfortably? up to 60 minutes cooking    How long can you walk comfortably? Pt reports walking for about 30 minutees    Patient Stated Goals To improve knee pain    Currently in Pain? No/denies    Pain Onset More than a month ago                             [facility] Adult PT Treatment/Exercise - 02/07/20 0001      Knee/Hip Exercises: Standing   Heel Raises Limitations x 25 at sink    Hip Abduction Both    Abduction Limitations x 15     Hip Extension Both    Extension Limitations x 15     Functional Squat 3 sets;10 reps    Wall Squat 10 reps;10 seconds    Other Standing Knee  Exercises marching x 20 with fingertip hold at counter, tried s hands --> incr difficulty, decr stance time      Knee/Hip Exercises: Supine   Bridges Strengthening;Both;2 sets;15 reps    Straight Leg Raises Strengthening;Both;1 set;20 reps                    PT Short Term Goals - 02/07/20 1512      PT SHORT TERM GOAL #2   Title Pt will be able to tolerate standing at least 20 minutes for household tasks    Status Achieved             PT Long Term Goals - 02/07/20 1513      PT LONG TERM GOAL #1   Title Pt will be independent with advanced HEP    Status Achieved      PT LONG TERM GOAL #2   Title Pt will have improved bilat knee ROM to 0 to 120 degrees    Status Achieved      PT LONG TERM GOAL #3   Title Pt will increase knee  and hip strength to at least 4+/5 for improved function    Status Achieved      PT LONG TERM GOAL #4   Title Pt will have improved SLS to at least 20 sec bilaterally for decreased fall risk    Baseline used 1 UE to support    Status Partially Met      PT LONG TERM GOAL #5   Title Pt will have TUG score of at least 7.7 sec to be within her age norms for reduced fall risk    Status Achieved                 Plan - 02/07/20 1501    Clinical Impression Statement Pt has made excellent progress with physical therapy interventions. She feels ready to discharge home with HEP and has met her goals with therapy at this point. She was advised (via interpreter) to ask MD for new Rx should she ever need to return.    Personal Factors and Comorbidities Comorbidity 1;Transportation    Comorbidities HTN, arthritis    Examination-Activity Limitations Locomotion Level;Stairs;Stand;Transfers;Squat    Examination-Participation Restrictions Community Activity;Cleaning;Meal Prep    Stability/Clinical Decision Making Stable/Uncomplicated    Rehab Potential Good    PT Frequency 1x / week    PT Duration 6 weeks    PT Treatment/Interventions ADLs/Self  Care Home Management;Gait training;Functional mobility training;Therapeutic activities;Therapeutic exercise;Balance training;Neuromuscular re-education;Patient/family education;Aquatic Therapy;Electrical Stimulation;Cryotherapy;Iontophoresis 7m/ml Dexamethasone;Ultrasound;Moist Heat;Stair training;Manual techniques;Passive range of motion;Dry needling;Taping;Vasopneumatic Device    PT Next Visit Plan DC    PT Home Exercise Plan Access Code: [email or web address] SLR, quad set in prone, hamstring stretch bilat, self knee flexion stretch, standing hip strengthening closed chain [email or web address]   Consulted and Agree with Plan of Care Patient           Patient will benefit from skilled therapeutic intervention in order to improve the following deficits and impairments:  Decreased range of motion, Difficulty walking, Decreased endurance, Pain, Decreased balance, Hypomobility, Impaired flexibility, Decreased mobility, Decreased strength  Visit Diagnosis: Difficulty in walking, not elsewhere classified  Chronic pain of left knee  Chronic pain of right knee  Stiffness of left knee, not elsewhere classified  Muscle weakness (generalized)     Problem List Patient Active Problem List   Diagnosis Date Noted  . Other peripheral vertigo, unspecified ear 09/18/2019  . Screen for colon cancer 09/03/2019  . Chronic pain of both knees 09/03/2019  . Brittle nails 01/23/2019  . Hair loss 01/23/2019  . Screening for HIV (human immunodeficiency virus) 01/23/2019  . Nail fragility 01/08/2019  . Vertigo 09/13/2018  . Pain of upper abdomen 06/13/2017  . Pain in both feet 11/03/2016  . Leg swelling 11/03/2016  . Chest pain 01/05/2016  . Encounter for immunization 01/05/2016  . Osteoporosis 08/14/2015  . Cystocele 05/22/2015  . Urinary urgency 03/25/2015  . UTI (urinary tract infection) 02/01/2014  . Personal history of colonic polyps-adenoma 05/14/2013  . Essential hypertension, benign 01/14/2012  .  Osteoarthritis of left knee 12/28/2011  . 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 Cough 11/09/2011  . Hyperlipidemia 08/18/2011  . Cataract 08/18/2011  . Low back pain 08/18/2011  . Muscle cramps at night 08/18/2011    KIzell Carolina10/28/2021, 3:43 PM  [facility] [address]Phone: [phone]  Fax:  [phone] Name: KDalene RobardsMRN: [id number] of Birth: [birth year]  PHYSICAL THERAPY DISCHARGE SUMMARY  Visits from Start of Care: 9  Current functional level related to goals / functional outcomes: Able to perform I/ADLs and leisure   Remaining deficits: Intermittent knee discomfort   Education / Equipment: HEP  Plan: Patient agrees to discharge.  Patient goals were met. Patient is being discharged due to meeting the stated rehab goals.  ?????

## 2020-02-13 ENCOUNTER — Encounter: Payer: Medicare HMO | Admitting: Physical Therapy

## 2020-02-20 ENCOUNTER — Encounter: Payer: Medicare HMO | Admitting: Physical Therapy

## 2020-02-28 ENCOUNTER — Other Ambulatory Visit: Payer: Self-pay | Admitting: *Deleted

## 2020-02-28 DIAGNOSIS — E785 Hyperlipidemia, unspecified: Secondary | ICD-10-CM

## 2020-02-28 MED ORDER — ROSUVASTATIN CALCIUM 5 MG PO TABS: 5.0000 mg | ORAL_TABLET | Freq: Every day | ORAL | 0 refills | Status: DC

## 2020-03-18 MED ORDER — ROSUVASTATIN CALCIUM 5 MG PO TABS: 5.0000 mg | ORAL_TABLET | Freq: Every day | ORAL | 0 refills | Status: DC

## 2020-03-18 NOTE — Addendum Note (Signed)
Addended by: Dorna Bloom on: 03/18/2020 02:22 PM   Modules accepted: Orders

## 2020-03-18 NOTE — Telephone Encounter (Signed)
Patients daughter calls nurse line requesting a refill on rosuvastatin. Informed daughter a 98 day supply was sent to [facility] on 11/18. Daughter reports she does not use this pharmacy anymore, she now uses [facility]. Daughter request I send medication to [facility].

## 2020-06-09 DIAGNOSIS — H442D3 Degenerative myopia with foveoschisis, bilateral eye: Secondary | ICD-10-CM | POA: Diagnosis not present

## 2020-06-12 ENCOUNTER — Other Ambulatory Visit: Payer: Self-pay | Admitting: Family Medicine

## 2020-06-12 DIAGNOSIS — E785 Hyperlipidemia, unspecified: Secondary | ICD-10-CM

## 2020-06-13 NOTE — Telephone Encounter (Signed)
Needs appointment in May

## 2020-06-20 ENCOUNTER — Other Ambulatory Visit: Payer: Self-pay | Admitting: Family Medicine

## 2020-06-20 DIAGNOSIS — M810 Age-related osteoporosis without current pathological fracture: Secondary | ICD-10-CM

## 2020-09-09 ENCOUNTER — Other Ambulatory Visit: Payer: Self-pay | Admitting: Family Medicine

## 2020-09-09 DIAGNOSIS — E785 Hyperlipidemia, unspecified: Secondary | ICD-10-CM

## 2020-09-09 NOTE — Telephone Encounter (Signed)
Needs appointment

## 2020-09-10 NOTE — Telephone Encounter (Signed)
Called and scheduled patient for appointment.

## 2020-09-19 ENCOUNTER — Ambulatory Visit (INDEPENDENT_AMBULATORY_CARE_PROVIDER_SITE_OTHER): Payer: Medicare HMO | Admitting: Family Medicine

## 2020-09-19 ENCOUNTER — Other Ambulatory Visit: Payer: Self-pay

## 2020-09-19 VITALS — BP 152/74 | HR 86 | Ht <= 58 in | Wt 152.2 lb

## 2020-09-19 DIAGNOSIS — Z86010 Personal history of colonic polyps: Secondary | ICD-10-CM | POA: Diagnosis not present

## 2020-09-19 DIAGNOSIS — I10 Essential (primary) hypertension: Secondary | ICD-10-CM | POA: Diagnosis not present

## 2020-09-19 DIAGNOSIS — E785 Hyperlipidemia, unspecified: Secondary | ICD-10-CM | POA: Diagnosis not present

## 2020-09-19 MED ORDER — AMLODIPINE BESYLATE 5 MG PO TABS: 5.0000 mg | ORAL_TABLET | Freq: Every day | ORAL | 3 refills | Status: DC

## 2020-09-19 MED ORDER — ROSUVASTATIN CALCIUM 5 MG PO TABS: 5.0000 mg | ORAL_TABLET | Freq: Every day | ORAL | 3 refills | Status: DC

## 2020-09-19 NOTE — Assessment & Plan Note (Signed)
Chronic and stable.  Continue Crestor.  Will obtain lipid panel today.  Her LDL goal should be less than 100.

## 2020-09-19 NOTE — Assessment & Plan Note (Signed)
BP is elevated today and looking at previous office visits, she has been consistently in the 276R systolic to 011 systolic.  Given her age, would have a goal of 140/90 for her blood pressure.  Did discuss with patient starting a blood pressure medicine and she was agreeable to this.  We will start amlodipine 5 mg daily.  Also obtain BMP today.  Follow-up in 1 month for blood pressure recheck.

## 2020-09-19 NOTE — Patient Instructions (Signed)
Thank you for coming to see me today. It was a pleasure. Today we talked about:   You are due for a colonoscopy.  Please use the form that we have given you to schedule this at your convenience.    We will get some labs today.  If they are abnormal or we need to do something about them, I will call you.  If they are normal, I will send you a message on MyChart (if it is active) or a letter in the mail.  If you don't hear from [place] in 2 weeks, please call the office at the number below.   We will start you on amlodipine for your blood pressure.  I have refilled your cholesterol medication.  Make sure you continue taking your fosamax for your bone strength.  You also need to take calcium 1000mg  and vitamin D 800 units with this.  You can consider a shingles vaccination at the pharmacy if you would like.  Please follow-up with new PCP in 1 month.  If you have any questions or concerns, please do not hesitate to call the office at [phone].  Best,   [place] Constable, DO

## 2020-09-19 NOTE — Assessment & Plan Note (Signed)
Patient was given information on rescheduling her colonoscopy as she is overdue.

## 2020-09-19 NOTE — Progress Notes (Signed)
    SUBJECTIVE:   CHIEF COMPLAINT / HPI:   HTN Not currently on medication and has been well controlled Occasionally checks BP at home, have been elevated on previous checks in office Denies chest pain, shortness of breath, leg swelling  Hyperlipidemia Current regimen: Crestor 5 mg daily Last lipid panel 09/03/2019, LDL 83 at that time Tolerating well  Osteoporosis Current regimen: Fosamax weekly, reports compliance Last bone density scan on 09/06/2019, T score of -3.4, worsened from -2.7 Calcium 1000mg , Vit D 800units reports taking No fractures No long term steroids or PPI  Patient had colon polyps noted on colonoscopy in 2015, was supposed to have a repeat colonoscopy performed in 2020, she was referred for this last year, but did not go because she is afraid of the pre-medication  COVID-vaccine, has had three Shingrix is needed, counseled  iPad [place] interpretor used for entirety of encounter, Leonette Nutting, [id number]  PERTINENT  PMH / PSH: HTN, OA, osteoporosis, HLD, history of colonic polyps  OBJECTIVE:   BP (!) 152/74   Pulse 86   Ht 4\' 9"  (1.448 m)   Wt 152 lb 3.2 oz (69 kg)   SpO2 96%   BMI 32.94 kg/m    Physical Exam:  General: 75 y.o. female in NAD Cardio: RRR no m/r/g Lungs: CTAB, no wheezing, no rhonchi, no crackles, no IWOB on RA Skin: warm and dry Extremities: No edema   ASSESSMENT/PLAN:   Essential hypertension, benign BP is elevated today and looking at previous office visits, she has been consistently in the 466Z systolic to 993 systolic.  Given her age, would have a goal of 140/90 for her blood pressure.  Did discuss with patient starting a blood pressure medicine and she was agreeable to this.  We will start amlodipine 5 mg daily.  Also obtain BMP today.  Follow-up in 1 month for blood pressure recheck.  Hyperlipidemia Chronic and stable.  Continue Crestor.  Will obtain lipid panel today.  Her LDL goal should be less than 100.  Personal history of  colonic polyps-adenoma Patient was given information on rescheduling her colonoscopy as she is overdue.   Counseled on importance of Shingrix vaccination and to obtain at pharmacy if she would desire.  Cleophas Dunker, [place]

## 2020-09-20 LAB — BASIC METABOLIC PANEL
BUN/Creatinine Ratio: 17 (ref 12–28)
BUN: 13 mg/dL (ref 8–27)
CO2: 27 mmol/L (ref 20–29)
Calcium: 10.7 mg/dL — ABNORMAL HIGH (ref 8.7–10.3)
Chloride: 102 mmol/L (ref 96–106)
Creatinine, Ser: 0.77 mg/dL (ref 0.57–1.00)
Glucose: 86 mg/dL (ref 65–99)
Potassium: 3.6 mmol/L (ref 3.5–5.2)
Sodium: 144 mmol/L (ref 134–144)
eGFR: 81 mL/min/{1.73_m2} (ref >59)

## 2020-09-20 LAB — LIPID PANEL
Chol/HDL Ratio: 3.3 ratio (ref 0.0–4.4)
Cholesterol, Total: 168 mg/dL (ref 100–199)
HDL: 51 mg/dL (ref >39)
LDL Chol Calc (NIH): 89 mg/dL (ref 0–99)
Triglycerides: 162 mg/dL — ABNORMAL HIGH (ref 0–149)
VLDL Cholesterol Cal: 28 mg/dL (ref 5–40)

## 2020-09-22 ENCOUNTER — Encounter: Payer: Self-pay | Admitting: Family Medicine

## 2020-09-29 ENCOUNTER — Other Ambulatory Visit: Payer: Self-pay | Admitting: Family Medicine

## 2020-09-29 DIAGNOSIS — M810 Age-related osteoporosis without current pathological fracture: Secondary | ICD-10-CM

## 2020-10-02 IMAGING — MR MR HEAD W/O CM
11 series · 48 of 48 positions shown · non-contrast
Comparison: None.

CLINICAL DATA: Peripheral vertigo

EXAM:
MRI HEAD WITHOUT CONTRAST
TECHNIQUE: Multiplanar, multiecho pulse sequences of the brain and surrounding
structures were obtained without intravenous contrast.

[Series 5: DWI · axial · 3.0mm · 1.36mm/px · z∈[-37,+104]mm · 6 of 96 slices shown (1 of 4)]
[im 1/96]
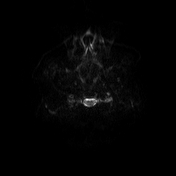
[im 20/96]
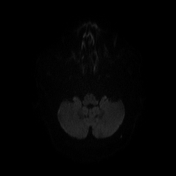
[im 39/96]
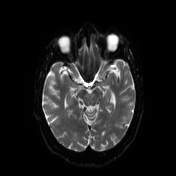
[im 58/96]
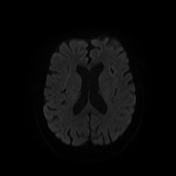
[im 77/96]
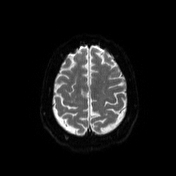
[im 96/96]
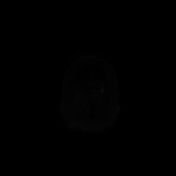

[Series 6: DWI · axial · 3.0mm · 1.36mm/px · z∈[-37,+104]mm · 4 of 48 slices shown (2 of 4)]
[im 1/48]
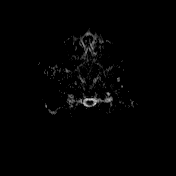
[im 16/48]
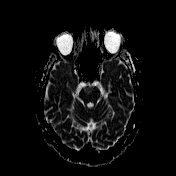
[im 32/48]
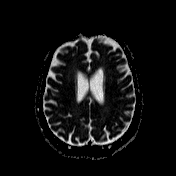
[im 48/48]
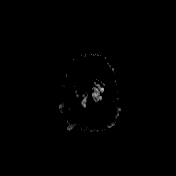

[Series 7: T1 · sagittal · 5.0mm · 0.75mm/px · 2 of 24 slices shown (1 of 2)]
[im 1/24]
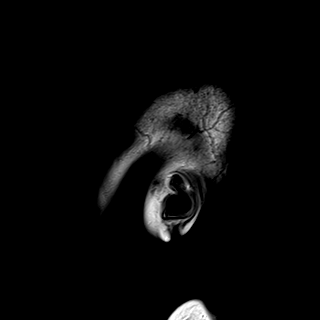
[im 24/24]
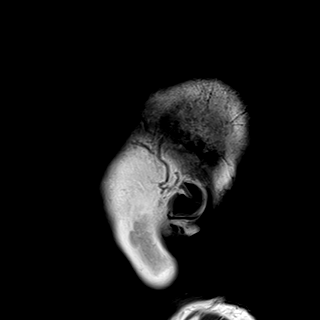

[Series 8: T2 · axial · 5.0mm · 0.62mm/px · z∈[-49,+114]mm · 2 of 26 slices shown (1 of 2)]
[im 1/26]
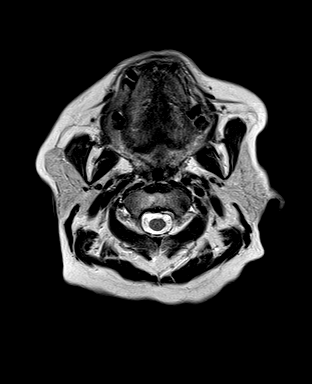
[im 26/26]
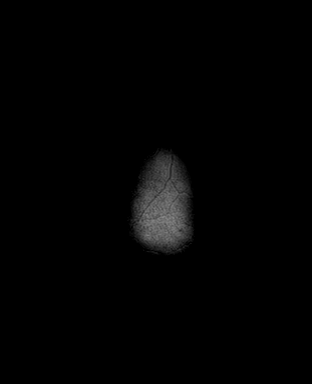

[Series 9: mip_images(sw) · axial · 24.0mm · 0.75mm/px · z∈[-63,+128]mm · 5 of 65 slices shown]
[im 1/65]
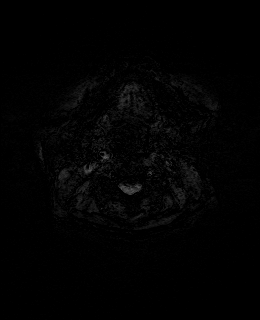
[im 17/65]
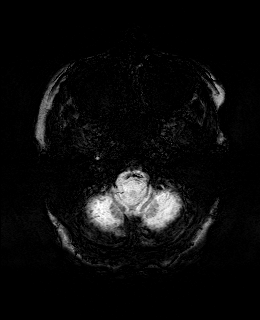
[im 33/65]
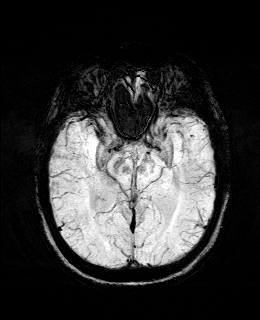
[im 49/65]
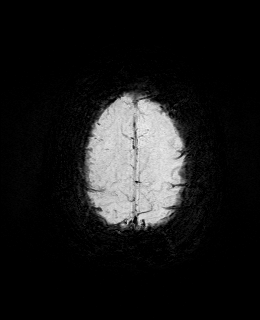
[im 65/65]
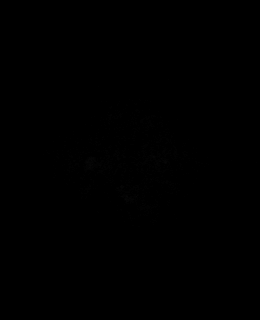

[Series 10: swi_images · axial · 3.0mm · 0.75mm/px · z∈[-74,+139]mm · 5 of 72 slices shown]
[im 1/72]
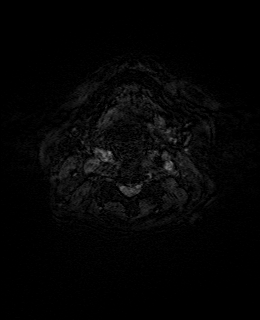
[im 18/72]
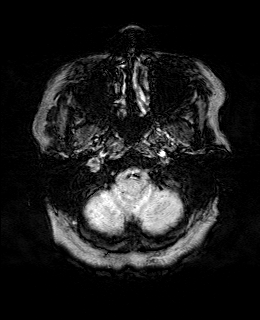
[im 36/72]
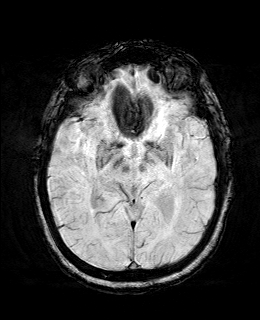
[im 54/72]
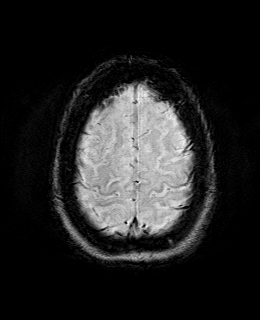
[im 72/72]
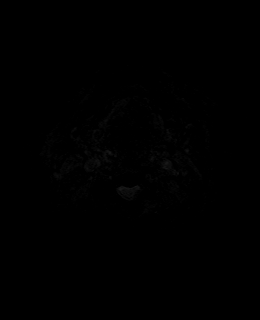

[Series 11: FLAIR · axial · 3.0mm · 0.75mm/px · z∈[-44,+109]mm · 4 of 52 slices shown]
[im 1/52]
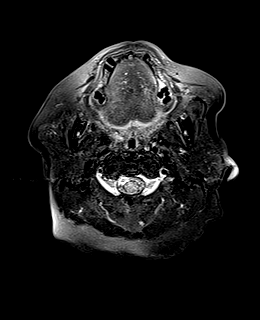
[im 18/52]
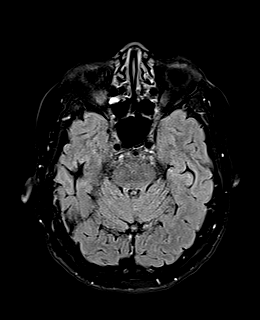
[im 35/52]
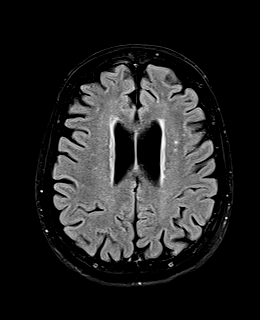
[im 52/52]
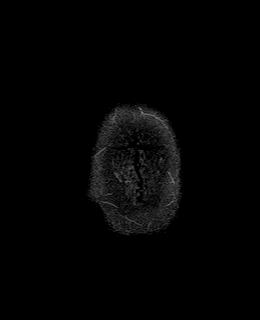

[Series 12: T1 · axial · 1.0mm · 0.94mm/px · z∈[-39,+104]mm · 11 of 144 slices shown (2 of 2)]
[im 1/144]
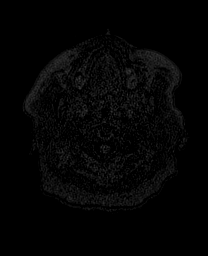
[im 15/144]
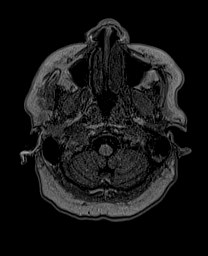
[im 29/144]
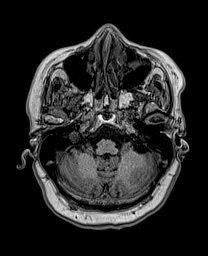
[im 43/144]
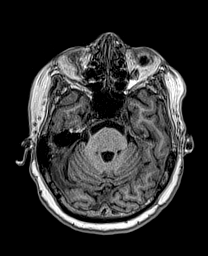
[im 58/144]
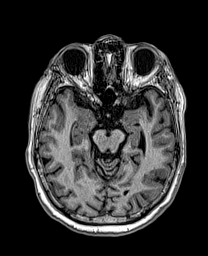
[im 72/144]
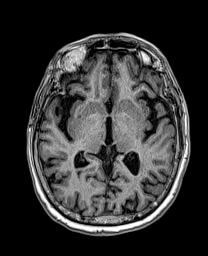
[im 86/144]
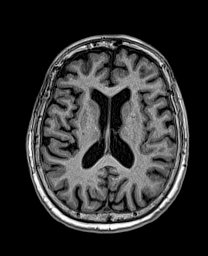
[im 101/144]
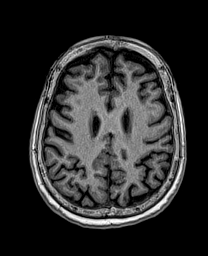
[im 115/144]
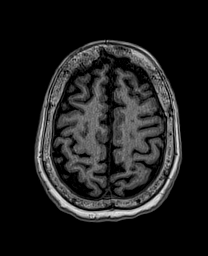
[im 129/144]
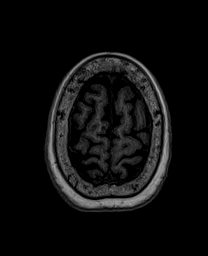
[im 144/144]
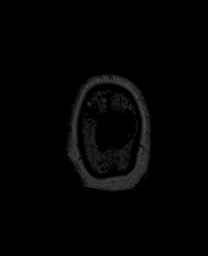

[Series 13: DWI · coronal · 5.0mm · 1.31mm/px · 5 of 64 slices shown (3 of 4)]
[im 1/64]
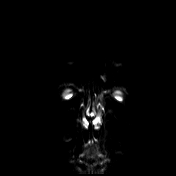
[im 16/64]
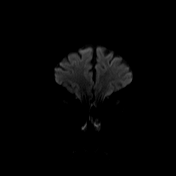
[im 32/64]
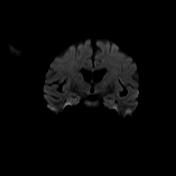
[im 48/64]
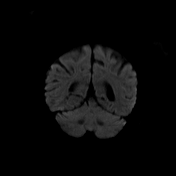
[im 64/64]
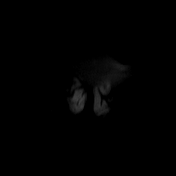

[Series 14: DWI · coronal · 5.0mm · 1.31mm/px · 2 of 32 slices shown (4 of 4)]
[im 1/32]
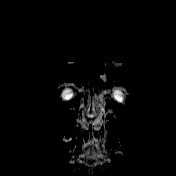
[im 32/32]
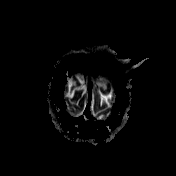

[Series 15: T2 · coronal · 5.0mm · 0.57mm/px · 2 of 32 slices shown (2 of 2)]
[im 1/32]
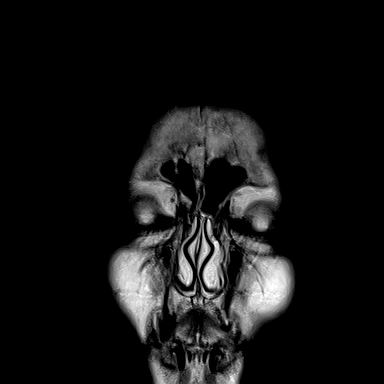
[im 32/32]
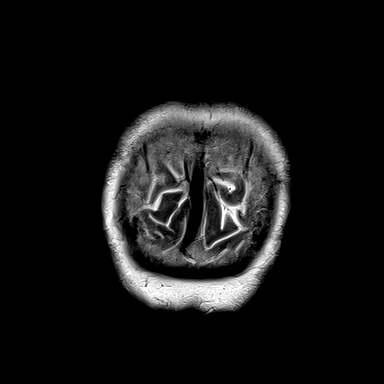

[48 of 48 positions shown; findings below may reference images not displayed]

FINDINGS: Brain: Mild atrophy. Negative for hydrocephalus. Negative for acute
infarct. Negative for hemorrhage or mass

Small white matter hyperintensities bilaterally consistent with mild
chronic microvascular ischemia. Brainstem normal.

Vascular: Normal arterial flow voids

Skull and upper cervical spine: Normal bone marrow.

Sinuses/Orbits: Mucosal edema paranasal sinuses.

Prominent staphyloma bilaterally.  Bilateral cataract extraction.

Other: None
IMPRESSION: Mild atrophy and mild chronic microvascular ischemic change in the
white matter. No acute intracranial abnormality.

## 2020-10-28 ENCOUNTER — Encounter: Payer: Self-pay | Admitting: Internal Medicine

## 2020-11-17 ENCOUNTER — Other Ambulatory Visit: Payer: Self-pay

## 2020-11-17 ENCOUNTER — Ambulatory Visit (AMBULATORY_SURGERY_CENTER): Payer: Self-pay | Admitting: *Deleted

## 2020-11-17 VITALS — Ht 61.0 in | Wt 153.0 lb

## 2020-11-17 DIAGNOSIS — Z86010 Personal history of colonic polyps: Secondary | ICD-10-CM

## 2020-11-17 MED ORDER — SUTAB 1479-225-188 MG PO TABS: 24.0000 | ORAL_TABLET | ORAL | 0 refills | Status: DC

## 2020-11-17 NOTE — Progress Notes (Signed)
No egg or soy allergy known to patient  No issues with past sedation with any surgeries or procedures Patient denies ever being told they had issues or difficulty with intubation  No FH of Malignant Hyperthermia No diet pills per patient No home 02 use per patient  No blood thinners per patient  Pt denies issues with constipation  No A fib or A flutter  EMMI video to pt or via [place] 19 guidelines implemented in [place] today with Pt and RN  Pt is fully vaccinated  for Covid   Due to the COVID-19 pandemic we are asking patients to follow certain guidelines.  Pt aware of COVID protocols and [facility] guidelines   INTERPRETER IN PV, DAUGHTER ALSO SPEAKS ENGLISH  PT ASKED FOR A TABLET PREP IN PV- GAVE SUTAB IN ENGLISH AS DAUGHTER STATES SHE CAN READ THESE INSTRUCTIONS FOR HER MOTHER

## 2020-12-01 ENCOUNTER — Other Ambulatory Visit: Payer: Self-pay

## 2020-12-01 ENCOUNTER — Ambulatory Visit (AMBULATORY_SURGERY_CENTER): Payer: Medicare HMO | Admitting: Internal Medicine

## 2020-12-01 ENCOUNTER — Encounter: Payer: Self-pay | Admitting: Internal Medicine

## 2020-12-01 VITALS — BP 111/66 | HR 77 | Temp 99.3°F | Resp 24 | Ht <= 58 in | Wt 153.0 lb

## 2020-12-01 DIAGNOSIS — D125 Benign neoplasm of sigmoid colon: Secondary | ICD-10-CM | POA: Diagnosis not present

## 2020-12-01 DIAGNOSIS — D122 Benign neoplasm of ascending colon: Secondary | ICD-10-CM | POA: Diagnosis not present

## 2020-12-01 DIAGNOSIS — Z86010 Personal history of colonic polyps: Secondary | ICD-10-CM | POA: Diagnosis not present

## 2020-12-01 DIAGNOSIS — D120 Benign neoplasm of cecum: Secondary | ICD-10-CM

## 2020-12-01 MED ORDER — SODIUM CHLORIDE 0.9 % IV SOLN: 500.0000 mL | Freq: Once | INTRAVENOUS | Status: DC

## 2020-12-01 NOTE — Progress Notes (Signed)
Pt's states no medical or surgical changes since previsit or office visit.   Check-in-AER  V/S-AS

## 2020-12-01 NOTE — Progress Notes (Signed)
[facility] History and Physical   Primary Care Physician:  Alcus Dad, MD   Reason for Procedure:   Hx colon polyps  Plan:    colonoscopy     HPI: Oney Ringstad is a 75 y.o. female here for polyp surveillance colonoscopy   Past Medical History:  Diagnosis Date   Arthritis    HANDS , LEGS   Cataract    REMOVED BOTH EYES   Hyperlipidemia    Hypertension    Kidney stone    Personal history of colonic polyps-adenoma 05/14/2013    Past Surgical History:  Procedure Laterality Date   ABDOMINAL HYSTERECTOMY     total vaginal with BSO, secondary to prolapse   COLONOSCOPY     POLYPECTOMY      Prior to Admission medications   Medication Sig Start Date End Date Taking? Authorizing Provider  amLODipine (NORVASC) 5 MG tablet Take 1 tablet (5 mg total) by mouth at bedtime. 09/19/20  Yes Meccariello, Bernita Raisin, DO  ascorbic acid (VITAMIN C) 1000 MG tablet Take by mouth.   Yes [provider]  BIOTIN PO Take by mouth.   Yes [provider]  Cholecalciferol (VITAMIN D3 PO) Take by mouth.   Yes [provider]  acetaminophen (TYLENOL) 500 MG tablet Take by mouth. Patient not taking: Reported on 12/01/2020 12/12/12   [provider]  alendronate (FOSAMAX) 70 MG tablet TAKE 1 TABLET EVERY 7 DAYS WITH A FULL GLASS OF WATER ON AN EMPTY STOMACH. 09/29/20   Meccariello, Bernita Raisin, DO  meclizine (ANTIVERT) 25 MG tablet Take 1 tablet (25 mg total) by mouth 3 (three) times daily as needed for dizziness. Patient not taking: No sig reported 09/17/19   Bonnita Hollow, MD  Multiple Vitamins-Minerals (CENTRUM SILVER PO) Take by mouth.    [provider]  rosuvastatin (CRESTOR) 5 MG tablet Take 1 tablet (5 mg total) by mouth daily. 09/19/20   Meccariello, Bernita Raisin, DO    Current Outpatient Medications  Medication Sig Dispense Refill   amLODipine (NORVASC) 5 MG tablet Take 1 tablet (5 mg total) by mouth at bedtime. 90 tablet 3   ascorbic acid  (VITAMIN C) 1000 MG tablet Take by mouth.     BIOTIN PO Take by mouth.     Cholecalciferol (VITAMIN D3 PO) Take by mouth.     acetaminophen (TYLENOL) 500 MG tablet Take by mouth. (Patient not taking: Reported on 12/01/2020)     alendronate (FOSAMAX) 70 MG tablet TAKE 1 TABLET EVERY 7 DAYS WITH A FULL GLASS OF WATER ON AN EMPTY STOMACH. 12 tablet 0   meclizine (ANTIVERT) 25 MG tablet Take 1 tablet (25 mg total) by mouth 3 (three) times daily as needed for dizziness. (Patient not taking: No sig reported) 30 tablet 0   Multiple Vitamins-Minerals (CENTRUM SILVER PO) Take by mouth.     rosuvastatin (CRESTOR) 5 MG tablet Take 1 tablet (5 mg total) by mouth daily. 90 tablet 3   Current Facility-Administered Medications  Medication Dose Route Frequency Provider Last Rate Last Admin   0.9 %  sodium chloride infusion  500 mL Intravenous Once Gatha Mayer, MD        Allergies as of 12/01/2020   (No Known Allergies)    Family History  Problem Relation Age of Onset   Hypertension Neg Hx    Heart disease Neg Hx    Colon cancer Neg Hx    Colon polyps Neg Hx    Pancreatic cancer Neg  Hx    Stomach cancer Neg Hx    Rectal cancer Neg Hx     Social History   Socioeconomic History   Marital status: Single    Spouse name: Not on file   Number of children: Not on file   Years of education: Not on file   Highest education level: Not on file  Occupational History   Not on file  Tobacco Use   Smoking status: Never   Smokeless tobacco: Never  Substance and Sexual Activity   Alcohol use: No   Drug use: No   Sexual activity: Never  Other Topics Concern   Not on file  Social History Narrative   Lives with daughter Larrie Kass, speaks vietnamese. Retired and single.   Social Determinants of Health   Financial Resource Strain: Not on file  Food Insecurity: Not on file  Transportation Needs: Not on file  Physical Activity: Not on file  Stress: Not on file  Social Connections: Not on file   Intimate Partner Violence: Not on file    Review of Systems:  All other review of systems negative except as mentioned in the HPI.  Physical Exam: Vital signs BP 128/66 (Patient Position: Sitting)   Pulse 97   Temp 99.3 F (37.4 C)   Ht '4\' 9"'$  (1.448 m)   Wt 153 lb (69.4 kg)   SpO2 97%   BMI 33.11 kg/m   General:   Alert,  Well-developed, well-nourished, pleasant and cooperative in NAD Lungs:  Clear throughout to auscultation.   Heart:  Regular rate and rhythm; no murmurs, clicks, rubs,  or gallops. Abdomen:  Soft, nontender and nondistended. Normal bowel sounds.   Neuro/Psych:  Alert and cooperative. Normal mood and affect. A and O x 3   '@Jordanny Waddington'$  Simonne Maffucci, MD, [facility] [phone] (pager) 12/01/2020 1:37 PM@

## 2020-12-01 NOTE — Progress Notes (Signed)
Called to room to assist during endoscopic procedure.  Patient ID and intended procedure confirmed with present staff. Received instructions for my participation in the procedure from the performing physician.  

## 2020-12-01 NOTE — Op Note (Addendum)
[facility] Patient Name: Theresa Duran Procedure Date: 12/01/2020 1:31 PM MRN: [id number] Endoscopist: Gatha Mayer , MD Age: 75 Referring MD:  Date of Birth: Nov 08, 1945 Gender: Female Account #: [id number] Procedure:                Colonoscopy Indications:              Surveillance: Personal history of adenomatous                            polyps on last colonoscopy > 5 years ago, Last                            colonoscopy: 2015 Medicines:                Propofol per Anesthesia, Monitored Anesthesia Care Procedure:                Pre-Anesthesia Assessment:                           - Prior to the procedure, a History and Physical                            was performed, and patient medications and                            allergies were reviewed. The patient's tolerance of                            previous anesthesia was also reviewed. The risks                            and benefits of the procedure and the sedation                            options and risks were discussed with the patient.                            All questions were answered, and informed consent                            was obtained. Prior Anticoagulants: The patient has                            taken no previous anticoagulant or antiplatelet                            agents. ASA Grade Assessment: II - A patient with                            mild systemic disease. After reviewing the risks                            and benefits, the patient was deemed in  satisfactory condition to undergo the procedure.                           After obtaining informed consent, the colonoscope                            was passed under direct vision. Throughout the                            procedure, the patient's blood pressure, pulse, and                            oxygen saturations were monitored continuously. The                            Olympus CF-HQ190L [id number])  Colonoscope was                            introduced through the anus and advanced to the the                            cecum, identified by appendiceal orifice and                            ileocecal valve. The colonoscopy was performed                            without difficulty. The patient tolerated the                            procedure well. The quality of the bowel                            preparation was excellent. The ileocecal valve,                            appendiceal orifice, and rectum were photographed.                            The bowel preparation used was Sutab via split dose                            instruction. Scope In: 1:42:24 PM Scope Out: 1:53:18 PM Scope Withdrawal Time: 0 hours 9 minutes 16 seconds  Total Procedure Duration: 0 hours 10 minutes 54 seconds  Findings:                 The perianal and digital rectal examinations were                            normal.                           Three sessile polyps were found in the sigmoid  colon, ascending colon and cecum. The polyps were                            diminutive in size. These polyps were removed with                            a cold snare. Resection and retrieval were                            complete. Verification of patient identification                            for the specimen was done. Estimated blood loss was                            minimal.                           External and internal hemorrhoids were found.                           The exam was otherwise without abnormality.                            Retroflexion in rectum was not possible - narrow                            vault ? prior surgery? Complications:            No immediate complications. Estimated Blood Loss:     Estimated blood loss was minimal. Impression:               - Three diminutive polyps in the sigmoid colon, in                            the ascending colon and in  the cecum, removed with                            a cold snare. Resected and retrieved.                           - External and internal hemorrhoids.                           - The examination was otherwise normal.                           - Personal history of colonic polyp diminutive                            adenoma 2015. Recommendation:           - Patient has a contact number available for                            emergencies. The signs and symptoms  of potential                            delayed complications were discussed with the                            patient. Return to normal activities tomorrow.                            Written discharge instructions were provided to the                            patient.                           - Resume previous diet.                           - Continue present medications.                           - Await pathology results. Gatha Mayer, MD 12/01/2020 2:06:46 PM This report has been signed electronically.

## 2020-12-01 NOTE — Patient Instructions (Addendum)
I found and removed 3 tiny polyps - all look benign. I will let you know pathology results and when to have another routine colonoscopy by mail and/or My Chart.  I appreciate the opportunity to care for you. Gatha Mayer, MD, FACG HM NAY QU V? ? TH?C HI?N TH? THU?T N?I SOI T?I TRUNG TM N?I SOI [place]: Vui lng xem bo co th? thu?t ? ???c g?i cho qu v?, n?u qu v? c b?t k? th?c m?c g trong su?t qu trnh th?m khm. N?u bo co th? thu?t khng th? gi?i ?p th?c m?c c?a qu v?, vui lng g?i cho bc s? chuyn khoa tiu ha c?a qu v? ?? ???c gi?i ?p. N?u qu v? ? yu c?u khng cung c?p thng tin chi ti?t v? k?t qu? th? thu?t cho ??i tc ch?m [place] c?a qu v?, th bo co th? thu?t s? ???c g?i trong m?t phong b ???c dn kn ?? qu v? xem khi thu?n ti?n.   QU V? C TH?: C?m gic ch??ng b?ng. Trung ti?n nhi?u h?n bnh th??ng. ?i b? c th? gip ??y ra ngoi khng kh ?i vo ???ng tiu ha trong khi th?c hi?n th? thu?t v gi?m ch??ng b?ng. N?u qu v? ti?n hnh n?i soi d??i (nh? n?i soi ??i trng ho?c soi k?t trng xch-ma b?ng ?ng m?m), qu v? c th? th?y cc ch?m mu ? phn ho?c trn gi?y v? sinh. N?u qu v? ? lm s?ch ??i trng ?? th?c hi?n th? thu?t, qu v? c th? khng ?i ??i ti?n nh? bnh th??ng trong vi ngy.  Vui Lng L?u : Qu v? c th? b? kch ?ng v ngh?t m?i ho?c ch?y n??c m?i. Tnh tr?ng ny l do ?nh h??ng c?a vi?c th? bnh oxy trong qu trnh th?c hi?n th? thu?t. Qu v? khng c?n lo l?ng, tnh tr?ng ny s? bi?n m?t sau m?t ho?c vi ngy.   CC TRI?U CH?NG C?N BO CO NGAY  Sau khi th?c hi?n n?i soi d??i (n?i soi ??i trng ho?c soi k?t trng xch-ma b?ng ?ng m?m): Phn c nhi?u mu ?au b?ng d? d?i ho?c ngy cng t?ng Xu?t hi?n v?t s?ng b?ng m?i, c?p tnh S?t t? 100F tr? ln   Sau khi th?c hi?n n?i soi trn (EGD)  Nn ra mu ho?c ch?t mu c ph s?m Xu?t hi?n c?n ?au ng?c ho?c ?au d??i x??ng b? vai m?i Nu?t ?au ho?c kh nu?t M?i b? kh th? S?t t? 100F tr? ln Phn ?en nh? m?c   ??i v?i cc v?n ?? kh?n c?p ho?c c?p c?u, qu v? c th? lin h? bc s? chuyn khoa tiu ha b?t k? lc no b?ng cch g?i ??n s? [phone].   CH? ?? ?N U?NG: Chng ti [employer] v? tr??c tin nn ?n nh?, nh?ng sau ? qu v? c th? ?n theo ch? ?? bnh th??ng. U?ng nhi?u n??c nh?ng ph?i trnh ?? u?ng c c?n trong 24 gi?.  HO?T ??NG: Qu v? c?n ln k? ho?ch ?? ngh? ng?i trong ngy hm nay v KHNG NN LI XE ho?c s? d?ng my mc n?ng cho ??n ngy mai (do tc d?ng c?a thu?c an th?n s? d?ng trong th? thu?t).   THEO DI: Nhn vin c?a chng ti s? g?i cho qu v? theo s? ?i?n tho?i trong b?nh n vo ngy lm vi?c ti?p theo sau ngy th?c hi?n th? thu?t ?? ki?m tra tnh tr?ng c?a qu v? v gi?i ?p cc cu h?i ho?c th?c  m?c c?a qu v? v? thng tin m qu v? ???c cung c?p sau khi th?c hi?n th? thu?t. N?u chng ti khng lin l?c ???c v?i qu v?, chng ti s? ?? l?i tin nh?n. Tuy nhin, n?u qu v? c?m th?y kh?e v khng g?p b?t k? s? c? no, qu v? khng c?n g?i l?i cho chng ti. Chng ti s? gi? ??nh r?ng qu v? ? tr? l?i sinh ho?t bnh th??ng v khng g?p b?t k? s? c? no.  N?u qu v? ???c l?y sinh thi?t, chng ti s? lin l?c v?i qu v? qua ?i?n tho?i ho?c th? trong 1-3 tu?n ti?p theo. Vui lng g?i cho chng ti theo s? [phone] n?u qu v? khng nh?n ???c thng tin v? k?t qu? sinh thi?t trong 3 tu?n.  CH? K/B?O M?TSander Nephew v? v/ho?c ??i tc ch?m [place] c?a qu v? ? k vo cc ti li?u s? ???c nh?p vo h? s? y t? ?i?n t? c?a qu v?. Ch? k ny xc nh?n r?ng cc thng tin trn ?y trong B?n Tm T?t [place] Th?m Khm c?a qu v? ? ???c xem xt v hi?u r. Qu v? v/ho?c

## 2020-12-01 NOTE — Progress Notes (Signed)
Sedate, gd SR's, VSS, report to RN 

## 2020-12-02 ENCOUNTER — Ambulatory Visit (INDEPENDENT_AMBULATORY_CARE_PROVIDER_SITE_OTHER): Payer: Medicare HMO

## 2020-12-02 DIAGNOSIS — Z0000 Encounter for general adult medical examination without abnormal findings: Secondary | ICD-10-CM

## 2020-12-02 NOTE — Patient Instructions (Signed)
  Ms. Yanke , Thank you for taking time to come for your Medicare Wellness Visit. I appreciate your ongoing commitment to your health goals. Please review the following plan we discussed and let me know if I can assist you in the future.   These are the goals we discussed:  Goals   None     This is a list of the screening recommended for you and due dates:  Health Maintenance  Topic Date Due   COVID-19 Vaccine (1) Never done   Mammogram  08/28/2021   Colon Cancer Screening  12/01/2025   Tetanus Vaccine  01/04/2026   Flu Shot  Completed   DEXA scan (bone density measurement)  Completed   Hepatitis C Screening: USPSTF Recommendation to screen - Ages 18-79 yo.  Completed   Pneumonia vaccines  Completed   Zoster (Shingles) Vaccine  Completed   HPV Vaccine  Aged Out

## 2020-12-02 NOTE — Progress Notes (Signed)
**Note Theresa-Identified via Obfuscation** Subjective:   Theresa Duran is a 75 y.o. female who presents for an Initial Medicare Annual Wellness Visit.  I connected with  Theresa Duran on 12/02/20 by an audio only telemedicine application and verified that I am speaking with the correct person using two identifiers.   I discussed the limitations, risks, security and privacy concerns of performing an evaluation and management service by telephone and the availability of in person appointments. I also discussed with the patient that there may be a patient responsible charge related to this service. The patient expressed understanding and verbally consented to this telephonic visit.  Location of Patient: Home with daughter Location of Provider: Office  List any persons and their role that are participating in the visit with the patient: Patient daughter, Theresa Duran, was with patient during the visit and assisted with visit due to language barrier.    Review of Systems     Defer to PCP Cardiac Risk Factors include: advanced age (>41mn, >>55women);hypertension     Objective:    There were no vitals filed for this visit. There is no height or weight on file to calculate BMI.  Advanced Directives 10/08/2019 09/17/2019 09/13/2018 11/23/2016 11/03/2016 01/21/2016 01/05/2016  Does Patient Have a Medical Advance Directive? No No No No No No No  Would patient like information on creating a medical advance directive? - No - Patient declined No - Patient declined No - Patient declined No - Patient declined No - patient declined information No - patient declined information    Current Medications (verified) Outpatient Encounter Medications as of 12/02/2020  Medication Sig   acetaminophen (TYLENOL) 500 MG tablet Take by mouth.   alendronate (FOSAMAX) 70 MG tablet TAKE 1 TABLET EVERY 7 DAYS WITH A FULL GLASS OF WATER ON AN EMPTY STOMACH.   amLODipine (NORVASC) 5 MG tablet Take 1 tablet (5 mg total) by mouth at bedtime.   ascorbic acid (VITAMIN  C) 1000 MG tablet Take by mouth.   BIOTIN PO Take by mouth.   Cholecalciferol (VITAMIN D3 PO) Take by mouth.   Multiple Vitamins-Minerals (CENTRUM SILVER PO) Take by mouth.   rosuvastatin (CRESTOR) 5 MG tablet Take 1 tablet (5 mg total) by mouth daily.   meclizine (ANTIVERT) 25 MG tablet Take 1 tablet (25 mg total) by mouth 3 (three) times daily as needed for dizziness. (Patient not taking: No sig reported)   No facility-administered encounter medications on file as of 12/02/2020.    Allergies (verified) Patient has no known allergies.   History: Past Medical History:  Diagnosis Date   Arthritis    HANDS , LEGS   Cataract    REMOVED BOTH EYES   Hyperlipidemia    Hypertension    Kidney stone    Personal history of colonic polyps-adenoma 05/14/2013   Past Surgical History:  Procedure Laterality Date   ABDOMINAL HYSTERECTOMY     total vaginal with BSO, secondary to prolapse   COLONOSCOPY     Family History  Problem Relation Age of Onset   Cancer Sister        Breast   Hypotension Sister    Hypertension Neg Hx    Heart disease Neg Hx    Colon cancer Neg Hx    Colon polyps Neg Hx    Pancreatic cancer Neg Hx    Stomach cancer Neg Hx    Rectal cancer Neg Hx    Social History   Socioeconomic History   Marital status: Single  Spouse name: Not on file   Number of children: 2   Years of education: 5th grade   Highest education level: 5th grade  Occupational History   Not on file  Tobacco Use   Smoking status: Never   Smokeless tobacco: Never  Vaping Use   Vaping Use: Never used  Substance and Sexual Activity   Alcohol use: No   Drug use: No   Sexual activity: Not Currently  Other Topics Concern   Not on file  Social History Narrative   Lives with daughter Theresa Duran, speaks vietnamese. Retired and single.   Social Determinants of Health   Financial Resource Strain: Low Risk    Difficulty of Paying Living Expenses: Not hard at all  Food Insecurity: No  Food Insecurity   Worried About Charity fundraiser in the Last Year: Never true   [place] in the Last Year: Never true  Transportation Needs: No Transportation Needs   Lack of Transportation (Medical): No   Lack of Transportation (Non-Medical): No  Physical Activity: Insufficiently Active   Days of Exercise per Week: 4 days   Minutes of Exercise per Session: 30 min  Stress: No Stress Concern Present   Feeling of Stress : Not at all  Social Connections: Socially Isolated   Frequency of Communication with Friends and Family: Once a week   Frequency of Social Gatherings with Friends and Family: Never   Attends Religious Services: 1 to 4 times per year   Active Member of [employer] or Organizations: No   Attends Music therapist: Never   Marital Status: Divorced    Tobacco Counseling Counseling given: No   Clinical Intake:  Pre-visit preparation completed: Yes  Pain : No/denies pain     Diabetes: No  How often do you need to have someone help you when you read instructions, pamphlets, or other written materials from your doctor or pharmacy?: 5 - Always What is the last grade level you completed in school?: 5th grade  Diabetic? No  Interpreter Needed?: No  Comments: Theresa Duran Information entered by :: [employer], Amelia   Activities of Daily Living In your present state of health, do you have any difficulty performing the following activities: 12/02/2020  Hearing? N  Vision? N  Difficulty concentrating or making decisions? Y  Walking or climbing stairs? Y  Dressing or bathing? N  Doing errands, shopping? N  Preparing Food and eating ? N  Using the Toilet? N  In the past six months, have you accidently leaked urine? Y  Comment Patient has difficulty at night.  Do you have problems with loss of bowel control? N  Managing your Medications? N  Managing your Finances? N  Housekeeping or managing your Housekeeping? N  Some recent  data might be hidden    Patient Care Team: Alcus Dad, MD as PCP - General (Family Medicine)  Indicate any recent Medical Services you may have received from other than [facility] providers in the past year (date may be approximate).     Assessment:   This is a routine wellness examination for Theresa Duran.  Hearing/Vision screen No results found.  Dietary issues and exercise activities discussed: Current Exercise Habits: Home exercise routine, Type of exercise: stretching;walking;strength training/weights, Time (Minutes): 30, Frequency (Times/Week): 3, Weekly Exercise (Minutes/Week): 90, Intensity: Mild (Patient does physical therapy)   Goals Addressed   None   Depression Screen PHQ 2/9 Scores 12/02/2020 09/19/2020 09/03/2019 09/13/2018 01/06/2018 11/23/2016 01/21/2016  PHQ -  2 Score 0 0 0 0 0 0 0  PHQ- 9 Score 2 - - - - - -    Fall Risk Fall Risk  12/02/2020 09/03/2019 01/23/2019 09/13/2018 11/23/2016  Falls in the past year? 0 0 0 0 No  Number falls in past yr: 0 0 - - -  Injury with Fall? 0 - - - -  Risk for fall due to : Orthopedic patient - - - -  Follow up Falls evaluation completed Falls evaluation completed - - -    FALL RISK PREVENTION PERTAINING TO THE HOME:  Any stairs in or around the home? No  If so, are there any without handrails?  N/A Home free of loose throw rugs in walkways, pet beds, electrical cords, etc? No  Adequate lighting in your home to reduce risk of falls? Yes   ASSISTIVE DEVICES UTILIZED TO PREVENT FALLS:  Life alert? No  Use of a cane, walker or w/c? No  Grab bars in the bathroom? No  Shower chair or bench in shower? No  Elevated toilet seat or a handicapped toilet? No   TIMED UP AND GO:  Was the test performed?  N/A .  Length of time to ambulate 10 feet: N/A sec.   Per patient statement: Gait slow and steady without use of assistive device  Cognitive Function: There was a language barrier and this was difficult to assess.   6CIT Screen 12/02/2020   What Year? 0 points  What month? 0 points  What time? 0 points  Count back from 20 0 points  Months in reverse 2 points  Repeat phrase 4 points  Total Score 6    Immunizations Immunization History  Administered Date(s) Administered   Influenza, High Dose Seasonal PF 06/05/2017, 02/02/2018, 12/03/2018, 11/22/2020   Influenza,inj,Quad PF,6+ Mos 01/31/2013, 01/31/2014, 02/03/2015, 01/05/2016   Influenza-Unspecified 03/01/2017, 01/10/2019   Pneumococcal Conjugate-13 02/03/2015   Pneumococcal Polysaccharide-23 03/02/2013   Tdap 01/05/2016   Zoster Recombinat (Shingrix) 06/23/2017, 08/29/2017    TDAP status: Up to date  Flu Vaccine status: Up to date  Pneumococcal vaccine status: Up to date  Covid-19 vaccine status: Completed vaccines Stated that it was completed at [facility].   Qualifies for Shingles Vaccine? Yes   Zostavax completed No   Shingrix Completed?: Yes  Screening Tests Health Maintenance  Topic Date Due   COVID-19 Vaccine (1) Never done   MAMMOGRAM  08/28/2021   COLONOSCOPY (Pts 45-88yr Insurance coverage will need to be confirmed)  12/01/2025   TETANUS/TDAP  01/04/2026   INFLUENZA VACCINE  Completed   DEXA SCAN  Completed   Hepatitis C Screening  Completed   PNA vac Low Risk Adult  Completed   Zoster Vaccines- Shingrix  Completed   HPV VACCINES  Aged Out    Health Maintenance  Health Maintenance Due  Topic Date Due   COVID-19 Vaccine (1) Never done    Colorectal cancer screening: Type of screening: Colonoscopy. Completed 12/01/2020. Repeat every 5 years  Mammogram status: Completed 08/29/2019. Repeat every year  Bone Density status: Completed 09/06/2019. Results reflect: Bone density results: OSTEOPOROSIS. Repeat every 2 years.  Lung Cancer Screening: (Low Dose CT Chest recommended if Age 74-80years, 30 pack-year currently smoking OR have quit w/in 15years.) does not qualify.   Lung Cancer Screening Referral: N/A  Additional  Screening:  Hepatitis C Screening: does qualify; Completed 09/06/2019  Vision Screening: Recommended annual ophthalmology exams for early detection of glaucoma and other disorders of the eye. Is the patient up  to date with their annual eye exam?  Yes  Who is the provider or what is the name of the office in which the patient attends annual eye exams? [facility] - scheduled in September 2022.  If pt is not established with a provider, would they like to be referred to a provider to establish care?  N/A .   Dental Screening: Recommended annual dental exams for proper oral hygiene  Community Resource Referral / Chronic Care Management: CRR required this visit?  No   CCM required this visit?  No      Plan:     I have personally reviewed and noted the following in the patient's chart:   Medical and social history Use of alcohol, tobacco or illicit drugs  Current medications and supplements including opioid prescriptions. Patient is not currently taking opioid prescriptions. Functional ability and status Nutritional status Physical activity Advanced directives List of other physicians Hospitalizations, surgeries, and ER visits in previous 12 months Vitals Screenings to include cognitive, depression, and falls Referrals and appointments  In addition, I have reviewed and discussed with patient certain preventive protocols, quality metrics, and best practice recommendations. A written personalized care plan for preventive services as well as general preventive health recommendations were provided to patient.     Tawni Pummel, [place]   12/02/2020   Nurse Notes:    Theresa Duran , Thank you for taking time to come for your Medicare Wellness Visit. I appreciate your ongoing commitment to your health goals. Please review the following plan we discussed and let me know if I can assist you in the future.   These are the goals we discussed:  Goals   None     This is a list of  the screening recommended for you and due dates:  Health Maintenance  Topic Date Due   COVID-19 Vaccine (1) Never done   Mammogram  08/28/2021   Colon Cancer Screening  12/01/2025   Tetanus Vaccine  01/04/2026   Flu Shot  Completed   DEXA scan (bone density measurement)  Completed   Hepatitis C Screening: USPSTF Recommendation to screen - Ages 18-79 yo.  Completed   Pneumonia vaccines  Completed   Zoster (Shingles) Vaccine  Completed   HPV Vaccine  Aged Out

## 2020-12-02 NOTE — Progress Notes (Deleted)
1st Attempt - LVM at 7:15pm. Last appt was double booked and ran over.

## 2020-12-03 ENCOUNTER — Telehealth: Payer: Self-pay

## 2020-12-03 ENCOUNTER — Encounter: Payer: Medicare HMO | Admitting: Internal Medicine

## 2020-12-03 NOTE — Telephone Encounter (Signed)
First post procedure follow up call, no answer 

## 2020-12-08 NOTE — Progress Notes (Signed)
I have reviewed this visit and agree with the documentation.   

## 2020-12-11 ENCOUNTER — Encounter: Payer: Self-pay | Admitting: Internal Medicine

## 2020-12-11 DIAGNOSIS — Z86010 Personal history of colonic polyps: Secondary | ICD-10-CM

## 2020-12-22 DIAGNOSIS — H442D3 Degenerative myopia with foveoschisis, bilateral eye: Secondary | ICD-10-CM | POA: Diagnosis not present

## 2020-12-22 DIAGNOSIS — H35343 Macular cyst, hole, or pseudohole, bilateral: Secondary | ICD-10-CM | POA: Diagnosis not present

## 2021-02-15 ENCOUNTER — Other Ambulatory Visit: Payer: Self-pay | Admitting: Family Medicine

## 2021-02-15 DIAGNOSIS — M810 Age-related osteoporosis without current pathological fracture: Secondary | ICD-10-CM

## 2021-05-05 ENCOUNTER — Telehealth: Payer: Self-pay

## 2021-05-05 DIAGNOSIS — U071 COVID-19: Secondary | ICD-10-CM

## 2021-05-05 MED ORDER — BENZONATATE 100 MG PO CAPS
100.0000 mg | ORAL_CAPSULE | Freq: Three times a day (TID) | ORAL | 0 refills | Status: DC | PRN
Start: 2021-05-05 — End: 2021-08-11

## 2021-05-05 MED ORDER — PAXLOVID (300/100) 20 X 150 MG & 10 X 100MG PO TBPK: 3.0000 | ORAL_TABLET | Freq: Two times a day (BID) | ORAL | 0 refills | Status: AC

## 2021-05-05 NOTE — Telephone Encounter (Signed)
Patients son calls nurse line reporting positive covid test this morning. Son reports symptom onset was yesterday morning with headache, cough and fatigue. Son reports "possible" fever on Sunday, however unsure. Son reports she has not felt feverish since. Son denies SOB, GI upset or congestion.  Conservative measures given. Red flags discussed.   They are interested in antiviral therapy due to her age.

## 2021-05-05 NOTE — Telephone Encounter (Signed)
Attempted to call patient's son using phone [place] interpreter. Name Then ID# [id number].  First number attempted [phone] was not in service. Next tried [phone] and was able to reach the daughter. Daughter did not need inter  Positive home COVID test today. Symptoms started 1/23. Family interested in antiviral medication. Checked her medications for potential interactions, the following were important:  # Rosuvastatin: Will stop statin while taking Paxlovid # Amlodipine: Will halve amlodipine from 5 mg to 2.5 mg daily while taking Paxlovid and up to 3 days afterwards. Will restart amlodipine at normal dose of 5 mg daily on 2/01.   Paxlovid script written for five days. If started tonight, last dose will be morning of Sunday 1/29.  Ezequiel Essex, MD

## 2021-07-05 ENCOUNTER — Other Ambulatory Visit: Payer: Self-pay | Admitting: Family Medicine

## 2021-07-05 DIAGNOSIS — M810 Age-related osteoporosis without current pathological fracture: Secondary | ICD-10-CM

## 2021-08-03 ENCOUNTER — Other Ambulatory Visit: Payer: Self-pay | Admitting: *Deleted

## 2021-08-03 DIAGNOSIS — E785 Hyperlipidemia, unspecified: Secondary | ICD-10-CM

## 2021-08-03 MED ORDER — ROSUVASTATIN CALCIUM 5 MG PO TABS: 5.0000 mg | ORAL_TABLET | Freq: Every day | ORAL | 1 refills | Status: DC

## 2021-08-11 ENCOUNTER — Ambulatory Visit (INDEPENDENT_AMBULATORY_CARE_PROVIDER_SITE_OTHER): Payer: Medicare Other | Admitting: Family Medicine

## 2021-08-11 ENCOUNTER — Encounter: Payer: Self-pay | Admitting: Family Medicine

## 2021-08-11 VITALS — BP 124/74 | HR 87 | Ht <= 58 in | Wt 152.0 lb

## 2021-08-11 DIAGNOSIS — M25522 Pain in left elbow: Secondary | ICD-10-CM

## 2021-08-11 DIAGNOSIS — M25512 Pain in left shoulder: Secondary | ICD-10-CM | POA: Diagnosis not present

## 2021-08-11 MED ORDER — DICLOFENAC SODIUM 1 % EX GEL: 2.0000 g | Freq: Three times a day (TID) | CUTANEOUS | 0 refills | Status: DC

## 2021-08-11 NOTE — Progress Notes (Signed)
? ? ?  SUBJECTIVE:  ? ?CHIEF COMPLAINT / HPI:  ? ?L Shoulder and Elbow Pain ?Patient had a fall 2.5 months ago ?Missed her foot on the stairs and fell forwards ?She's had mild left shoulder and left elbow pain since that time ?Pain is neither improving nor worsening ?Pain does not interfere with daily function but is bothersome ?She is right hand dominant ?She did not hit her head or sustain any other injury during the fall ? ?She has tried [place] which does not provide significant relief. Has not tried any medications or other remedies.  ?They would like referral to chiropractor. They prefer holistic approach when possible. ? ?No prior hx of shoulder or elbow issues ? ?PERTINENT  PMH / PSH: HTN, HLD, osteoporosis ? ?OBJECTIVE:  ? ?BP 124/74   Pulse 87   Ht '4\' 9"'$  (1.448 m)   Wt 152 lb (68.9 kg)   SpO2 97%   BMI 32.89 kg/m?   ?General: NAD, pleasant, able to participate in exam ?Respiratory: No respiratory distress ?Skin: warm and dry, no rashes noted ?Psych: Normal affect and mood ?Neuro: grossly intact ?MSK: ?L shoulder- Inspection reveals no obvious deformity, atrophy, or asymmetry. No swelling ?Palpation is normal with no TTP over [facility] joint or bicipital groove. ?Full ROM in flexion, abduction, internal/external rotation ?NV intact distally ?Special Tests:  ?- Impingement: Neg empty can sign. ?- Supraspinatous: Negative empty can.  5/5 strength with resisted flexion at 20 degrees ?[facility] Joint: Negative cross arm ?- No painful arc and no drop arm sign ? ?L elbow- inspection normal. No tenderness to palpation. Full ROM with flexion and extension. No pain with resisted wrist extension. Resisted external rotation of the shoulder causes pain in her elbow region.  ? ?ASSESSMENT/PLAN:  ? ?Left shoulder and elbow pain ?S/p fall 2.5 months ago. Pain is mild and exam is largely unremarkable. Suspect she may have some rotator cuff injury/tendinopathy. Etiology of elbow pain unclear- not consistent with epicondylitis. No  concern for fracture. ?-Patient and daughter are very adamant they would like to see a chiropractor for this. Informed them they do not need a referral for chiropractor. ?-Recommended sports medicine referral- they are unsure about this approach but I will place referral in case there is no improvement w/chiropractor. Also given phone # in AVS ?-Trial of voltaren gel as needed ?  ?Patient declined formal interpreter. Daughter was present and interpreted throughout encounter. ? ? ?Alcus Dad, MD ?[place]  ?

## 2021-08-11 NOTE — Patient Instructions (Addendum)
It was great to see you! ? ?I have sent a prescription for Voltaren gel to your pharmacy. This is a cream-like version of Ibuprofen. You can use it up to 3x daily as needed. ? ?We do not place referrals to chiropractors. You can call any chiropractor office and get an appointment without a referral.  ? ?I would recommend seeing the sports medicine specialists instead of the chiropractor. I have placed a referral and you can also call to schedule an appointment. [phone] ? ?Take care, ?Dr Rock Nephew ?

## 2021-08-12 DIAGNOSIS — M25512 Pain in left shoulder: Secondary | ICD-10-CM | POA: Insufficient documentation

## 2021-08-12 NOTE — Assessment & Plan Note (Addendum)
S/p fall 2.5 months ago. Pain is mild and exam is largely unremarkable. Suspect she may have some rotator cuff injury/tendinopathy. Etiology of elbow pain unclear- not consistent with epicondylitis. No concern for fracture. ?-Patient and daughter are very adamant they would like to see a chiropractor for this. Informed them they do not need a referral for chiropractor. ?-Recommended sports medicine referral- they are unsure about this approach but I will place referral in case there is no improvement w/chiropractor. Also given phone # in AVS ?-Trial of voltaren gel as needed ?

## 2021-08-21 ENCOUNTER — Ambulatory Visit: Payer: Medicare Other | Admitting: Family Medicine

## 2021-09-11 DIAGNOSIS — H442D3 Degenerative myopia with foveoschisis, bilateral eye: Secondary | ICD-10-CM | POA: Diagnosis not present

## 2021-09-11 DIAGNOSIS — H35343 Macular cyst, hole, or pseudohole, bilateral: Secondary | ICD-10-CM | POA: Diagnosis not present

## 2021-09-15 ENCOUNTER — Other Ambulatory Visit: Payer: Self-pay | Admitting: *Deleted

## 2021-09-15 DIAGNOSIS — I10 Essential (primary) hypertension: Secondary | ICD-10-CM

## 2021-09-15 DIAGNOSIS — M810 Age-related osteoporosis without current pathological fracture: Secondary | ICD-10-CM

## 2021-09-16 MED ORDER — ALENDRONATE SODIUM 70 MG PO TABS: ORAL_TABLET | ORAL | 0 refills | Status: DC

## 2021-09-16 MED ORDER — AMLODIPINE BESYLATE 5 MG PO TABS: 5.0000 mg | ORAL_TABLET | Freq: Every day | ORAL | 0 refills | Status: DC

## 2021-09-29 DIAGNOSIS — Z961 Presence of intraocular lens: Secondary | ICD-10-CM | POA: Diagnosis not present

## 2021-09-29 DIAGNOSIS — H442D3 Degenerative myopia with foveoschisis, bilateral eye: Secondary | ICD-10-CM | POA: Diagnosis not present

## 2021-09-29 DIAGNOSIS — H35373 Puckering of macula, bilateral: Secondary | ICD-10-CM | POA: Diagnosis not present

## 2021-09-29 DIAGNOSIS — H35342 Macular cyst, hole, or pseudohole, left eye: Secondary | ICD-10-CM | POA: Diagnosis not present

## 2021-10-19 ENCOUNTER — Other Ambulatory Visit: Payer: Self-pay | Admitting: *Deleted

## 2021-10-19 DIAGNOSIS — E785 Hyperlipidemia, unspecified: Secondary | ICD-10-CM

## 2021-10-19 MED ORDER — ROSUVASTATIN CALCIUM 5 MG PO TABS: 5.0000 mg | ORAL_TABLET | Freq: Every day | ORAL | 1 refills | Status: DC

## 2021-11-13 ENCOUNTER — Other Ambulatory Visit: Payer: Self-pay | Admitting: Family Medicine

## 2021-11-13 DIAGNOSIS — M810 Age-related osteoporosis without current pathological fracture: Secondary | ICD-10-CM

## 2021-11-16 ENCOUNTER — Other Ambulatory Visit: Payer: Self-pay | Admitting: Family Medicine

## 2021-11-16 DIAGNOSIS — E785 Hyperlipidemia, unspecified: Secondary | ICD-10-CM

## 2021-11-17 ENCOUNTER — Other Ambulatory Visit: Payer: Self-pay | Admitting: Family Medicine

## 2021-11-17 DIAGNOSIS — I10 Essential (primary) hypertension: Secondary | ICD-10-CM

## 2021-12-24 DIAGNOSIS — Z9889 Other specified postprocedural states: Secondary | ICD-10-CM | POA: Diagnosis not present

## 2021-12-24 DIAGNOSIS — K219 Gastro-esophageal reflux disease without esophagitis: Secondary | ICD-10-CM | POA: Diagnosis not present

## 2021-12-24 DIAGNOSIS — M19042 Primary osteoarthritis, left hand: Secondary | ICD-10-CM | POA: Diagnosis not present

## 2021-12-24 DIAGNOSIS — M19041 Primary osteoarthritis, right hand: Secondary | ICD-10-CM | POA: Diagnosis not present

## 2021-12-24 DIAGNOSIS — I10 Essential (primary) hypertension: Secondary | ICD-10-CM | POA: Diagnosis not present

## 2021-12-24 DIAGNOSIS — H35342 Macular cyst, hole, or pseudohole, left eye: Secondary | ICD-10-CM | POA: Diagnosis not present

## 2021-12-24 DIAGNOSIS — Z79899 Other long term (current) drug therapy: Secondary | ICD-10-CM | POA: Diagnosis not present

## 2022-01-26 DIAGNOSIS — H35373 Puckering of macula, bilateral: Secondary | ICD-10-CM | POA: Diagnosis not present

## 2022-01-26 DIAGNOSIS — H35343 Macular cyst, hole, or pseudohole, bilateral: Secondary | ICD-10-CM | POA: Diagnosis not present

## 2022-01-26 DIAGNOSIS — H35342 Macular cyst, hole, or pseudohole, left eye: Secondary | ICD-10-CM | POA: Diagnosis not present

## 2022-02-05 DIAGNOSIS — Z23 Encounter for immunization: Secondary | ICD-10-CM | POA: Diagnosis not present

## 2022-02-05 DIAGNOSIS — I10 Essential (primary) hypertension: Secondary | ICD-10-CM | POA: Diagnosis not present

## 2022-02-05 DIAGNOSIS — M810 Age-related osteoporosis without current pathological fracture: Secondary | ICD-10-CM | POA: Diagnosis not present

## 2022-02-05 DIAGNOSIS — E785 Hyperlipidemia, unspecified: Secondary | ICD-10-CM | POA: Diagnosis not present

## 2022-03-14 ENCOUNTER — Encounter (HOSPITAL_COMMUNITY): Payer: Self-pay

## 2022-03-14 ENCOUNTER — Emergency Department (HOSPITAL_COMMUNITY): Payer: Medicare Other

## 2022-03-14 ENCOUNTER — Other Ambulatory Visit: Payer: Self-pay

## 2022-03-14 ENCOUNTER — Inpatient Hospital Stay (HOSPITAL_COMMUNITY)
Admission: EM | Admit: 2022-03-14 | Discharge: 2022-03-16 | DRG: 395 | Disposition: A | Discharge status: Home / Self Care | Payer: Medicare Other | Attending: Internal Medicine | Admitting: Internal Medicine

## 2022-03-14 DIAGNOSIS — Z1152 Encounter for screening for COVID-19: Secondary | ICD-10-CM | POA: Diagnosis not present

## 2022-03-14 DIAGNOSIS — K922 Gastrointestinal hemorrhage, unspecified: Secondary | ICD-10-CM | POA: Diagnosis not present

## 2022-03-14 DIAGNOSIS — Z8719 Personal history of other diseases of the digestive system: Secondary | ICD-10-CM | POA: Diagnosis not present

## 2022-03-14 DIAGNOSIS — K559 Vascular disorder of intestine, unspecified: Secondary | ICD-10-CM | POA: Diagnosis not present

## 2022-03-14 DIAGNOSIS — K625 Hemorrhage of anus and rectum: Principal | ICD-10-CM

## 2022-03-14 DIAGNOSIS — E876 Hypokalemia: Secondary | ICD-10-CM | POA: Diagnosis present

## 2022-03-14 DIAGNOSIS — Z803 Family history of malignant neoplasm of breast: Secondary | ICD-10-CM

## 2022-03-14 DIAGNOSIS — E785 Hyperlipidemia, unspecified: Secondary | ICD-10-CM | POA: Diagnosis present

## 2022-03-14 DIAGNOSIS — N200 Calculus of kidney: Secondary | ICD-10-CM | POA: Diagnosis not present

## 2022-03-14 DIAGNOSIS — I10 Essential (primary) hypertension: Secondary | ICD-10-CM | POA: Diagnosis present

## 2022-03-14 DIAGNOSIS — R42 Dizziness and giddiness: Secondary | ICD-10-CM | POA: Diagnosis not present

## 2022-03-14 DIAGNOSIS — R059 Cough, unspecified: Secondary | ICD-10-CM | POA: Diagnosis present

## 2022-03-14 DIAGNOSIS — Z79899 Other long term (current) drug therapy: Secondary | ICD-10-CM

## 2022-03-14 DIAGNOSIS — R109 Unspecified abdominal pain: Secondary | ICD-10-CM | POA: Diagnosis not present

## 2022-03-14 DIAGNOSIS — K55039 Acute (reversible) ischemia of large intestine, extent unspecified: Principal | ICD-10-CM | POA: Diagnosis present

## 2022-03-14 DIAGNOSIS — Z90710 Acquired absence of both cervix and uterus: Secondary | ICD-10-CM

## 2022-03-14 DIAGNOSIS — I700 Atherosclerosis of aorta: Secondary | ICD-10-CM | POA: Diagnosis not present

## 2022-03-14 DIAGNOSIS — Z603 Acculturation difficulty: Secondary | ICD-10-CM | POA: Diagnosis present

## 2022-03-14 DIAGNOSIS — Z87442 Personal history of urinary calculi: Secondary | ICD-10-CM

## 2022-03-14 DIAGNOSIS — Z7983 Long term (current) use of bisphosphonates: Secondary | ICD-10-CM

## 2022-03-14 LAB — CBC WITH DIFFERENTIAL/PLATELET
Abs Immature Granulocytes: 0.01 10*3/uL (ref 0.00–0.07)
Basophils Absolute: 0 10*3/uL (ref 0.0–0.1)
Basophils Relative: 0 %
Eosinophils Absolute: 0.1 10*3/uL (ref 0.0–0.5)
Eosinophils Relative: 1 %
HCT: 41.5 % (ref 36.0–46.0)
Hemoglobin: 13.7 g/dL (ref 12.0–15.0)
Immature Granulocytes: 0 %
Lymphocytes Relative: 25 %
Lymphs Abs: 2 10*3/uL (ref 0.7–4.0)
MCH: 31.9 pg (ref 26.0–34.0)
MCHC: 33 g/dL (ref 30.0–36.0)
MCV: 96.5 fL (ref 80.0–100.0)
Monocytes Absolute: 0.6 10*3/uL (ref 0.1–1.0)
Monocytes Relative: 7 %
Neutro Abs: 5.2 10*3/uL (ref 1.7–7.7)
Neutrophils Relative %: 67 %
Platelets: 239 10*3/uL (ref 150–400)
RBC: 4.3 MIL/uL (ref 3.87–5.11)
RDW: 12.6 % (ref 11.5–15.5)
WBC: 7.9 10*3/uL (ref 4.0–10.5)
nRBC: 0 % (ref 0.0–0.2)

## 2022-03-14 LAB — URINALYSIS, MICROSCOPIC (REFLEX)

## 2022-03-14 LAB — TYPE AND SCREEN
ABO/RH(D): B POS
Antibody Screen: NEGATIVE

## 2022-03-14 LAB — URINALYSIS, ROUTINE W REFLEX MICROSCOPIC
Bilirubin Urine: NEGATIVE
Glucose, UA: NEGATIVE mg/dL
Ketones, ur: NEGATIVE mg/dL
Nitrite: NEGATIVE
Protein, ur: 30 mg/dL — AB
Specific Gravity, Urine: 1.02 (ref 1.005–1.030)
pH: 6.5 (ref 5.0–8.0)

## 2022-03-14 LAB — HEMOGLOBIN AND HEMATOCRIT, BLOOD
HCT: 40.2 % (ref 36.0–46.0)
Hemoglobin: 13.3 g/dL (ref 12.0–15.0)

## 2022-03-14 LAB — COMPREHENSIVE METABOLIC PANEL
ALT: 26 U/L (ref 0–44)
AST: 24 U/L (ref 15–41)
Albumin: 4 g/dL (ref 3.5–5.0)
Alkaline Phosphatase: 49 U/L (ref 38–126)
Anion gap: 10 (ref 5–15)
BUN: 13 mg/dL (ref 8–23)
CO2: 26 mmol/L (ref 22–32)
Calcium: 9.3 mg/dL (ref 8.9–10.3)
Chloride: 106 mmol/L (ref 98–111)
Creatinine, Ser: 0.81 mg/dL (ref 0.44–1.00)
GFR, Estimated: >60 mL/min (ref >60)
Glucose, Bld: 122 mg/dL — ABNORMAL HIGH (ref 70–99)
Potassium: 3.2 mmol/L — ABNORMAL LOW (ref 3.5–5.1)
Sodium: 142 mmol/L (ref 135–145)
Total Bilirubin: 0.8 mg/dL (ref 0.3–1.2)
Total Protein: 7.5 g/dL (ref 6.5–8.1)

## 2022-03-14 LAB — ABO/RH: ABO/RH(D): B POS

## 2022-03-14 LAB — SARS CORONAVIRUS 2 BY RT PCR: SARS Coronavirus 2 by RT PCR: NEGATIVE

## 2022-03-14 LAB — POC OCCULT BLOOD, ED: Fecal Occult Bld: POSITIVE — AB

## 2022-03-14 LAB — LIPASE, BLOOD: Lipase: 38 U/L (ref 11–51)

## 2022-03-14 MED ORDER — AMLODIPINE BESYLATE 5 MG PO TABS
5.0000 mg | ORAL_TABLET | Freq: Every day | ORAL | Status: DC
  Administered 2022-03-14 – 2022-03-15 (×2): 5 mg via ORAL
  Filled 2022-03-14 (×2): qty 1

## 2022-03-14 MED ORDER — ACETAMINOPHEN 650 MG RE SUPP: 650.0000 mg | Freq: Four times a day (QID) | RECTAL | Status: DC | PRN

## 2022-03-14 MED ORDER — SODIUM CHLORIDE 0.9 % IV SOLN: INTRAVENOUS | Status: DC

## 2022-03-14 MED ORDER — ONDANSETRON HCL 4 MG/2ML IJ SOLN
4.0000 mg | Freq: Four times a day (QID) | INTRAMUSCULAR | Status: DC | PRN
  Administered 2022-03-14 – 2022-03-15 (×3): 4 mg via INTRAVENOUS
  Filled 2022-03-14 (×3): qty 2

## 2022-03-14 MED ORDER — METRONIDAZOLE 500 MG/100ML IV SOLN
500.0000 mg | Freq: Two times a day (BID) | INTRAVENOUS | Status: DC
  Administered 2022-03-14 – 2022-03-16 (×4): 500 mg via INTRAVENOUS
  Filled 2022-03-14 (×4): qty 100

## 2022-03-14 MED ORDER — SODIUM CHLORIDE 0.9% FLUSH
3.0000 mL | Freq: Two times a day (BID) | INTRAVENOUS | Status: DC
  Administered 2022-03-14 – 2022-03-16 (×3): 3 mL via INTRAVENOUS

## 2022-03-14 MED ORDER — HYDROCODONE-ACETAMINOPHEN 5-325 MG PO TABS
1.0000 | ORAL_TABLET | ORAL | Status: DC | PRN
  Administered 2022-03-15: 1 via ORAL
  Filled 2022-03-14: qty 1

## 2022-03-14 MED ORDER — IOHEXOL 300 MG/ML  SOLN
100.0000 mL | Freq: Once | INTRAMUSCULAR | Status: AC | PRN
  Administered 2022-03-14: 100 mL via INTRAVENOUS

## 2022-03-14 MED ORDER — BENZONATATE 100 MG PO CAPS
100.0000 mg | ORAL_CAPSULE | Freq: Two times a day (BID) | ORAL | Status: DC | PRN
  Administered 2022-03-14 – 2022-03-16 (×3): 100 mg via ORAL
  Filled 2022-03-14 (×3): qty 1

## 2022-03-14 MED ORDER — ROSUVASTATIN CALCIUM 5 MG PO TABS
5.0000 mg | ORAL_TABLET | Freq: Every day | ORAL | Status: DC
  Administered 2022-03-14 – 2022-03-15 (×2): 5 mg via ORAL
  Filled 2022-03-14 (×2): qty 1

## 2022-03-14 MED ORDER — SODIUM CHLORIDE 0.9 % IV BOLUS
1000.0000 mL | Freq: Once | INTRAVENOUS | Status: AC
  Administered 2022-03-14: 1000 mL via INTRAVENOUS

## 2022-03-14 MED ORDER — ACETAMINOPHEN 325 MG PO TABS
650.0000 mg | ORAL_TABLET | Freq: Four times a day (QID) | ORAL | Status: DC | PRN
  Administered 2022-03-14: 650 mg via ORAL
  Filled 2022-03-14: qty 2

## 2022-03-14 MED ORDER — ONDANSETRON HCL 4 MG PO TABS: 4.0000 mg | ORAL_TABLET | Freq: Four times a day (QID) | ORAL | Status: DC | PRN

## 2022-03-14 MED ORDER — CIPROFLOXACIN IN D5W 400 MG/200ML IV SOLN
400.0000 mg | Freq: Two times a day (BID) | INTRAVENOUS | Status: DC
  Administered 2022-03-14 – 2022-03-16 (×4): 400 mg via INTRAVENOUS
  Filled 2022-03-14 (×4): qty 200

## 2022-03-14 MED ORDER — POTASSIUM CHLORIDE CRYS ER 20 MEQ PO TBCR
40.0000 meq | EXTENDED_RELEASE_TABLET | Freq: Once | ORAL | Status: AC
  Administered 2022-03-14: 40 meq via ORAL
  Filled 2022-03-14: qty 2

## 2022-03-14 MED ORDER — ONDANSETRON HCL 4 MG/2ML IJ SOLN
4.0000 mg | Freq: Once | INTRAMUSCULAR | Status: AC
  Administered 2022-03-14: 4 mg via INTRAVENOUS
  Filled 2022-03-14: qty 2

## 2022-03-14 NOTE — ED Triage Notes (Signed)
Patient's daughter is interpreting for the patient.  Patient reports that she had abdominal pain last night and then she had 2 stools with dark blood. Patient states there was very little stool, mostly blood.

## 2022-03-14 NOTE — Progress Notes (Signed)
Pharmacy Antibiotic Note  Theresa Duran is a 76 y.o. female admitted on 03/14/2022 with  intra-abdominal infection . Pharmacy has been consulted for ciprofloxacin dosing.  Plan: -Ciprofloxacin 400 mg IV BID -Flagyl per MD  Height: '4\' 5"'$  (134.6 cm) Weight: 70.3 kg (155 lb) IBW/kg (Calculated) : 29.4  Temp (24hrs), Avg:98.2 F (36.8 C), Min:97.6 F (36.4 C), Max:98.7 F (37.1 C)  Recent Labs  Lab 03/14/22 1218  WBC 7.9  CREATININE 0.81    Estimated Creatinine Clearance: 43.4 mL/min (by C-G formula based on SCr of 0.81 mg/dL).    No Known Allergies  Antimicrobials this admission: Ciprofloxacin 12/3 >> Flagyl 12/3 >>  Dose adjustments this admission: NA  Microbiology results: NA  Thank you for allowing pharmacy to be a part of this patient's care.  Tawnya Crook, PharmD, BCPS Clinical Pharmacist 03/14/2022 5:03 PM

## 2022-03-14 NOTE — H&P (Addendum)
History and Physical    Theresa Duran NWG:[id number] DOB: 14-Sep-1945 DOA: 03/14/2022  PCP: Alcus Dad, MD  Patient coming from: Home  Chief Complaint: BRBPR  HPI: Theresa Duran is a 76 y.o. female with medical history significant of hypertension, hyperlipidemia who presents to the hospital with bright red blood per rectum.  Patient daughter is at bedside who translates.  She states that around 8 PM last night, patient started having blood mixed with stool.  Also admits to some lower abdominal pain.  Since then, she has had more than 10 episodes of blood per rectum.  Never had this before.  Denies using any blood thinners, aspirin or any ibuprofen use.  No fevers at home.  Has had a dry cough over the last 2 weeks.  Denies any shortness of breath or chest pain.  No nausea or vomiting.  No other source of bleeding including vaginal bleeding or vomiting blood.  ED Course: Labs show stable hemoglobin 13.7.  Hypokalemia 3.2.  FOBT positive.  CT abdomen pelvis revealed nonspecific colitis in the distal descending colon, without diverticular disease, perforation or abscess.  Review of Systems: As per HPI. Otherwise, all other review of systems reviewed and are negative.   Past Medical History:  Diagnosis Date   Arthritis    HANDS , LEGS   Cataract    REMOVED BOTH EYES   Hyperlipidemia    Hypertension    Kidney stone    Personal history of colonic polyps-adenoma 05/14/2013    Past Surgical History:  Procedure Laterality Date   ABDOMINAL HYSTERECTOMY     total vaginal with BSO, secondary to prolapse   COLONOSCOPY       reports that she has never smoked. She has never used smokeless tobacco. She reports that she does not drink alcohol and does not use drugs.  No Known Allergies  Family History  Problem Relation Age of Onset   Cancer Sister        Breast   Hypotension Sister    Hypertension Neg Hx    Heart disease Neg Hx    Colon cancer Neg Hx    Colon polyps Neg Hx     Pancreatic cancer Neg Hx    Stomach cancer Neg Hx    Rectal cancer Neg Hx     Prior to Admission medications   Medication Sig Start Date End Date Taking? Authorizing Provider  alendronate (FOSAMAX) 70 MG tablet TAKE 1 TABLET BY MOUTH WEEKLY  WITH 8 OZ OF PLAIN WATER 30  MINUTES BEFORE FIRST FOOD, DRINK OR MEDS. STAY UPRIGHT FOR 30  MINS Patient taking differently: Take 70 mg by mouth See admin instructions. TAKE 70 MG (1 TABLET) BY MOUTH ON SUNDAYS WITH 8 OZ OF PLAIN WATER 30 MINUTES BEFORE FIRST FOOD, DRINK OR MEDS. STAY UPRIGHT FOR 30  MINS. 11/13/21  Yes Alcus Dad, MD  amLODipine (NORVASC) 5 MG tablet TAKE 1 TABLET BY MOUTH AT  BEDTIME Patient taking differently: Take 5 mg by mouth daily. 11/17/21  Yes Alcus Dad, MD  Cholecalciferol (VITAMIN D3 PO) Take 1 capsule by mouth daily.   Yes [provider]  Dextromethorphan-guaiFENesin (DELSYM COUGH/CHEST CONGEST DM) 5-100 MG/5ML LIQD Take 10 mLs by mouth every 12 (twelve) hours as needed (for coughing and/or congestion).   Yes [provider]  Multiple Vitamins-Minerals (CENTRUM SILVER PO) Take 1 tablet by mouth daily with breakfast.   Yes [provider]  rosuvastatin (CRESTOR) 5 MG tablet TAKE 1 TABLET BY MOUTH DAILY  Patient taking differently: Take 5 mg by mouth at bedtime. 11/17/21  Yes Alcus Dad, MD  diclofenac Sodium (VOLTAREN) 1 % GEL Apply 2 g topically 3 (three) times daily. Patient not taking: Reported on 03/14/2022 08/11/21   Alcus Dad, MD    Physical Exam: Vitals:   03/14/22 1545 03/14/22 1549 03/14/22 1550 03/14/22 1623  BP: (!) 144/70  127/74 124/76  Pulse: 99  91 85  Resp: '16  18 17  '$ Temp:  97.6 F (36.4 C)    TempSrc:  Oral    SpO2: 93%  92% 95%  Weight:      Height:        Constitutional: NAD, calm, comfortable Eyes: PERRL, lids and conjunctivae normal ENMT: Mucous membranes are moist. Normal dentition.  Respiratory: Clear to auscultation bilaterally, no wheezing, no  crackles. Normal respiratory effort. No accessory muscle use. No conversational dyspnea  Cardiovascular: Regular rate and rhythm, no murmurs. No extremity edema.  Abdomen: Soft, nondistended, mild tenderness to palpation periumbilical. Bowel sounds positive.  Musculoskeletal: No joint deformity upper and lower extremities. No contractures. Normal muscle tone.  Skin: no rashes, lesions, ulcers on exposed skin  Neurologic: Alert and oriented, speech fluent, CN 2-12 grossly intact. No focal deficits.   Psychiatric: Normal judgment and insight. Normal mood and affect   Labs on Admission: I have personally reviewed following labs and imaging studies  CBC: Recent Labs  Lab 03/14/22 1218  WBC 7.9  NEUTROABS 5.2  HGB 13.7  HCT 41.5  MCV 96.5  PLT 034   Basic Metabolic Panel: Recent Labs  Lab 03/14/22 1218  NA 142  K 3.2*  CL 106  CO2 26  GLUCOSE 122*  BUN 13  CREATININE 0.81  CALCIUM 9.3   GFR: Estimated Creatinine Clearance: 43.4 mL/min (by C-G formula based on SCr of 0.81 mg/dL). Liver Function Tests: Recent Labs  Lab 03/14/22 1218  AST 24  ALT 26  ALKPHOS 49  BILITOT 0.8  PROT 7.5  ALBUMIN 4.0   Recent Labs  Lab 03/14/22 1218  LIPASE 38   No results for input(s): "AMMONIA" in the last 168 hours. Coagulation Profile: No results for input(s): "INR", "PROTIME" in the last 168 hours. Cardiac Enzymes: No results for input(s): "CKTOTAL", "CKMB", "CKMBINDEX", "TROPONINI" in the last 168 hours. BNP (last 3 results) No results for input(s): "PROBNP" in the last 8760 hours. HbA1C: No results for input(s): "HGBA1C" in the last 72 hours. CBG: No results for input(s): "GLUCAP" in the last 168 hours. Lipid Profile: No results for input(s): "CHOL", "HDL", "LDLCALC", "TRIG", "CHOLHDL", "LDLDIRECT" in the last 72 hours. Thyroid Function Tests: No results for input(s): "TSH", "T4TOTAL", "FREET4", "T3FREE", "THYROIDAB" in the last 72 hours. Anemia Panel: No results for  input(s): "VITAMINB12", "FOLATE", "FERRITIN", "TIBC", "IRON", "RETICCTPCT" in the last 72 hours. Urine analysis:    Component Value Date/Time   COLORURINE YELLOW 03/14/2022 1219   APPEARANCEUR HAZY (A) 03/14/2022 1219   LABSPEC 1.020 03/14/2022 1219   PHURINE 6.5 03/14/2022 1219   GLUCOSEU NEGATIVE 03/14/2022 1219   HGBUR MODERATE (A) 03/14/2022 1219   BILIRUBINUR NEGATIVE 03/14/2022 1219   BILIRUBINUR NEG 04/24/2015 1034   KETONESUR NEGATIVE 03/14/2022 1219   PROTEINUR 30 (A) 03/14/2022 1219   UROBILINOGEN 0.2 04/24/2015 1034   NITRITE NEGATIVE 03/14/2022 1219   LEUKOCYTESUR SMALL (A) 03/14/2022 1219   Sepsis Labs: !!!!!!!!!!!!!!!!!!!!!!!!!!!!!!!!!!!!!!!!!!!! '@LABRCNTIP'$ (procalcitonin:4,lacticidven:4) )No results found for this or any previous visit (from the past 240 hour(s)).   Radiological Exams on Admission: CT ABDOMEN PELVIS  W CONTRAST  Result Date: 03/14/2022 CLINICAL DATA:  Left lower quadrant pain. Rectal bleeding. Suspected diverticulitis. EXAM: CT ABDOMEN AND PELVIS WITH CONTRAST TECHNIQUE: Multidetector CT imaging of the abdomen and pelvis was performed using the standard protocol following bolus administration of intravenous contrast. RADIATION DOSE REDUCTION: This exam was performed according to the departmental dose-optimization program which includes automated exposure control, adjustment of the mA and/or kV according to patient size and/or use of iterative reconstruction technique. CONTRAST:  180m OMNIPAQUE IOHEXOL 300 MG/ML  SOLN COMPARISON:  11/04/2015 FINDINGS: Lower Chest: No acute findings. Hepatobiliary: No hepatic masses identified. Tiny calcified gallstone is again noted, however there is no evidence of cholecystitis or biliary ductal dilatation. Pancreas:  No mass or inflammatory changes. Spleen: Within normal limits in size and appearance. Adrenals/Urinary Tract: No suspicious masses identified. 3 mm calculus noted in lower pole of left kidney. No evidence of  ureteral calculi or hydronephrosis. Unremarkable unopacified urinary bladder. Stomach/Bowel: Normal appendix visualized. Mild wall thickening is seen involving the distal descending colon mild pericolonic soft tissue stranding. There is no evidence of diverticular disease in this area. No evidence of perforation, abscess, or bowel obstruction. Vascular/Lymphatic: No pathologically enlarged lymph nodes. No acute vascular findings. Aortic atherosclerotic calcification incidentally noted. Reproductive: Prior hysterectomy noted. Adnexal regions are unremarkable in appearance. Other:  None. Musculoskeletal:  No suspicious bone lesions identified. IMPRESSION: Mild nonspecific colitis involving the distal descending colon. No evidence of diverticular disease, perforation, or abscess. Cholelithiasis. No radiographic evidence of cholecystitis. Tiny left renal calculus. No evidence of ureteral calculi or hydronephrosis. Aortic Atherosclerosis (ICD10-I70.0). Electronically Signed   By: JMarlaine HindM.D.   On: 03/14/2022 15:08     Assessment/Plan Principal Problem:   GI bleed Active Problems:   Hyperlipidemia   Essential hypertension, benign   Hypokalemia   Hematochezia, colitis -CT abdomen pelvis showing nonspecific colitis in the descending colon  -Hemoglobin remains stable, continue to monitor H&H -GI consult -Record shows that patient underwent colonoscopy in 12/01/2020 by Dr. GCarlean Purland at that time, it showed polyps, external and internal hemorrhoids -Cipro/Flagyl -IV fluid -Clear liquid diet  Hypertension -Norvasc  Hyperlipidemia -Crestor   Hypokalemia -Replace   DVT prophylaxis: SCD   Code Status: Full code Family Communication: Daughter at bedside Disposition Plan: Pending further improvement Consults called: Asked EDP to message [facility] for non-urgent consult - Dr. HBenson Norwaycovering notified   Severity of Illness: The appropriate patient status for this patient is OBSERVATION.  Observation status is judged to be reasonable and necessary in order to provide the required intensity of service to ensure the patient's safety. The patient's presenting symptoms, physical exam findings, and initial radiographic and laboratory data in the context of their medical condition is felt to place them at decreased risk for further clinical deterioration. Furthermore, it is anticipated that the patient will be medically stable for discharge from the hospital within 2 midnights of admission.   JDessa Phi DO [facility] 03/14/2022, 4:54 PM   Available via Epic secure chat 7am-7pm After these hours, please refer to coverage provider listed on [email or web address]

## 2022-03-14 NOTE — ED Provider Notes (Signed)
[facility] DEPT Provider Note   CSN: [id number] Arrival date & time: 03/14/22  1127     History {Add pertinent medical, surgical, social history, OB history to HPI:1} Chief Complaint  Patient presents with   Rectal Bleeding   Abdominal Pain    Theresa Duran is a 76 y.o. female.  Patient has had numerous bouts of bright red blood per rectum today.  She complains of some weakness.  Patient has a history of hyperlipidemia   Rectal Bleeding Associated symptoms: abdominal pain   Abdominal Pain Associated symptoms: hematochezia        Home Medications Prior to Admission medications   Medication Sig Start Date End Date Taking? Authorizing Provider  alendronate (FOSAMAX) 70 MG tablet TAKE 1 TABLET BY MOUTH WEEKLY  WITH 8 OZ OF PLAIN WATER 30  MINUTES BEFORE FIRST FOOD, DRINK OR MEDS. STAY UPRIGHT FOR 30  MINS Patient taking differently: Take 70 mg by mouth See admin instructions. TAKE 70 MG (1 TABLET) BY MOUTH ON SUNDAYS WITH 8 OZ OF PLAIN WATER 30 MINUTES BEFORE FIRST FOOD, DRINK OR MEDS. STAY UPRIGHT FOR 30  MINS. 11/13/21  Yes Alcus Dad, MD  amLODipine (NORVASC) 5 MG tablet TAKE 1 TABLET BY MOUTH AT  BEDTIME Patient taking differently: Take 5 mg by mouth daily. 11/17/21  Yes Alcus Dad, MD  Cholecalciferol (VITAMIN D3 PO) Take 1 capsule by mouth daily.   Yes [provider]  Dextromethorphan-guaiFENesin (DELSYM COUGH/CHEST CONGEST DM) 5-100 MG/5ML LIQD Take 10 mLs by mouth every 12 (twelve) hours as needed (for coughing and/or congestion).   Yes [provider]  Multiple Vitamins-Minerals (CENTRUM SILVER PO) Take 1 tablet by mouth daily with breakfast.   Yes [provider]  rosuvastatin (CRESTOR) 5 MG tablet TAKE 1 TABLET BY MOUTH DAILY Patient taking differently: Take 5 mg by mouth at bedtime. 11/17/21  Yes Alcus Dad, MD  diclofenac Sodium (VOLTAREN) 1 % GEL Apply 2 g topically 3 (three) times daily. Patient  not taking: Reported on 03/14/2022 08/11/21   Alcus Dad, MD      Allergies    Patient has no known allergies.    Review of Systems   Review of Systems  Gastrointestinal:  Positive for abdominal pain and hematochezia.    Physical Exam Updated Vital Signs BP 124/76   Pulse 85   Temp 97.6 F (36.4 C) (Oral)   Resp 17   Ht '4\' 5"'$  (1.346 m)   Wt 70.3 kg   SpO2 95%   BMI 38.80 kg/m  Physical Exam  ED Results / Procedures / Treatments   Labs (all labs ordered are listed, but only abnormal results are displayed) Labs Reviewed  COMPREHENSIVE METABOLIC PANEL - Abnormal; Notable for the following components:      Result Value   Potassium 3.2 (*)    Glucose, Bld 122 (*)    All other components within normal limits  URINALYSIS, ROUTINE W REFLEX MICROSCOPIC - Abnormal; Notable for the following components:   APPearance HAZY (*)    Hgb urine dipstick MODERATE (*)    Protein, ur 30 (*)    Leukocytes,Ua SMALL (*)    All other components within normal limits  URINALYSIS, MICROSCOPIC (REFLEX) - Abnormal; Notable for the following components:   Bacteria, UA RARE (*)    Non Squamous Epithelial PRESENT (*)    All other components within normal limits  POC OCCULT BLOOD, ED - Abnormal; Notable for the following components:   Fecal Occult Bld  POSITIVE (*)    All other components within normal limits  CBC WITH DIFFERENTIAL/PLATELET  LIPASE, BLOOD  TYPE AND SCREEN  ABO/RH    EKG None  Radiology CT ABDOMEN PELVIS W CONTRAST  Result Date: 03/14/2022 CLINICAL DATA:  Left lower quadrant pain. Rectal bleeding. Suspected diverticulitis. EXAM: CT ABDOMEN AND PELVIS WITH CONTRAST TECHNIQUE: Multidetector CT imaging of the abdomen and pelvis was performed using the standard protocol following bolus administration of intravenous contrast. RADIATION DOSE REDUCTION: This exam was performed according to the departmental dose-optimization program which includes automated exposure control,  adjustment of the mA and/or kV according to patient size and/or use of iterative reconstruction technique. CONTRAST:  130m OMNIPAQUE IOHEXOL 300 MG/ML  SOLN COMPARISON:  11/04/2015 FINDINGS: Lower Chest: No acute findings. Hepatobiliary: No hepatic masses identified. Tiny calcified gallstone is again noted, however there is no evidence of cholecystitis or biliary ductal dilatation. Pancreas:  No mass or inflammatory changes. Spleen: Within normal limits in size and appearance. Adrenals/Urinary Tract: No suspicious masses identified. 3 mm calculus noted in lower pole of left kidney. No evidence of ureteral calculi or hydronephrosis. Unremarkable unopacified urinary bladder. Stomach/Bowel: Normal appendix visualized. Mild wall thickening is seen involving the distal descending colon mild pericolonic soft tissue stranding. There is no evidence of diverticular disease in this area. No evidence of perforation, abscess, or bowel obstruction. Vascular/Lymphatic: No pathologically enlarged lymph nodes. No acute vascular findings. Aortic atherosclerotic calcification incidentally noted. Reproductive: Prior hysterectomy noted. Adnexal regions are unremarkable in appearance. Other:  None. Musculoskeletal:  No suspicious bone lesions identified. IMPRESSION: Mild nonspecific colitis involving the distal descending colon. No evidence of diverticular disease, perforation, or abscess. Cholelithiasis. No radiographic evidence of cholecystitis. Tiny left renal calculus. No evidence of ureteral calculi or hydronephrosis. Aortic Atherosclerosis (ICD10-I70.0). Electronically Signed   By: JMarlaine HindM.D.   On: 03/14/2022 15:08    Procedures Procedures  {Document cardiac monitor, telemetry assessment procedure when appropriate:1}  Medications Ordered in ED Medications  sodium chloride 0.9 % bolus 1,000 mL (0 mLs Intravenous Stopped 03/14/22 1533)  iohexol (OMNIPAQUE) 300 MG/ML solution 100 mL (100 mLs Intravenous Contrast Given  03/14/22 1442)  ondansetron (ZOFRAN) injection 4 mg (4 mg Intravenous Given 03/14/22 1516)    ED Course/ Medical Decision Making/ A&P                           Medical Decision Making Risk Prescription drug management. Decision regarding hospitalization.   Patient with rectal bleeding and colitis.  She will be admitted by the hospitalist and [facility] will consult tomorrow  {Document critical care time when appropriate:1} {Document review of labs and clinical decision tools ie heart score, Chads2Vasc2 etc:1}  {Document your independent review of radiology images, and any outside records:1} {Document your discussion with family members, caretakers, and with consultants:1} {Document social determinants of health affecting pt's care:1} {Document your decision making why or why not admission, treatments were needed:1} Final Clinical Impression(s) / ED Diagnoses Final diagnoses:  Rectal bleeding    Rx / DC Orders ED Discharge Orders     None

## 2022-03-14 NOTE — Plan of Care (Signed)
  Problem: Nutrition: Goal: Adequate nutrition will be maintained Outcome: Progressing   Problem: Elimination: Goal: Will not experience complications related to bowel motility Outcome: Progressing   Problem: Pain Managment: Goal: General experience of comfort will improve Outcome: Progressing   

## 2022-03-14 NOTE — ED Provider Triage Note (Signed)
Emergency Medicine Provider Triage Evaluation Note  Sreya Froio , a 76 y.o. female  was evaluated in triage.  Pt complains of rectal bleeding since 8 pm last night. She has noticed dark red blood in her stool about two times. She also has associated lower abdominal pain mostly in the center.  Review of Systems  Positive:  Negative:   Physical Exam  BP 119/76   Pulse 100   Temp 98.7 F (37.1 C) (Oral)   Resp 14   Ht '4\' 5"'$  (1.346 m)   Wt 70.3 kg   SpO2 96%   BMI 38.80 kg/m  Gen:   Awake, no distress   Resp:  Normal effort  MSK:   Moves extremities without difficulty  Other:  +suprapubic tenderness, some LLQ  Medical Decision Making  Medically screening exam initiated at 11:55 AM.  Appropriate orders placed.  [name] was informed that the remainder of the evaluation will be completed by another provider, this initial triage assessment does not replace that evaluation, and the importance of remaining in the ED until their evaluation is complete.     Adolphus Birchwood, [place] 03/14/22 1156

## 2022-03-15 DIAGNOSIS — R42 Dizziness and giddiness: Secondary | ICD-10-CM | POA: Diagnosis present

## 2022-03-15 DIAGNOSIS — Z90710 Acquired absence of both cervix and uterus: Secondary | ICD-10-CM | POA: Diagnosis not present

## 2022-03-15 DIAGNOSIS — Z7983 Long term (current) use of bisphosphonates: Secondary | ICD-10-CM | POA: Diagnosis not present

## 2022-03-15 DIAGNOSIS — Z79899 Other long term (current) drug therapy: Secondary | ICD-10-CM | POA: Diagnosis not present

## 2022-03-15 DIAGNOSIS — Z803 Family history of malignant neoplasm of breast: Secondary | ICD-10-CM | POA: Diagnosis not present

## 2022-03-15 DIAGNOSIS — R059 Cough, unspecified: Secondary | ICD-10-CM | POA: Diagnosis present

## 2022-03-15 DIAGNOSIS — K55039 Acute (reversible) ischemia of large intestine, extent unspecified: Secondary | ICD-10-CM | POA: Diagnosis present

## 2022-03-15 DIAGNOSIS — K559 Vascular disorder of intestine, unspecified: Secondary | ICD-10-CM | POA: Diagnosis not present

## 2022-03-15 DIAGNOSIS — E876 Hypokalemia: Secondary | ICD-10-CM | POA: Diagnosis present

## 2022-03-15 DIAGNOSIS — Z603 Acculturation difficulty: Secondary | ICD-10-CM | POA: Diagnosis present

## 2022-03-15 DIAGNOSIS — I10 Essential (primary) hypertension: Secondary | ICD-10-CM | POA: Diagnosis present

## 2022-03-15 DIAGNOSIS — K922 Gastrointestinal hemorrhage, unspecified: Secondary | ICD-10-CM | POA: Diagnosis not present

## 2022-03-15 DIAGNOSIS — E785 Hyperlipidemia, unspecified: Secondary | ICD-10-CM | POA: Diagnosis present

## 2022-03-15 DIAGNOSIS — Z8719 Personal history of other diseases of the digestive system: Secondary | ICD-10-CM | POA: Diagnosis not present

## 2022-03-15 DIAGNOSIS — Z87442 Personal history of urinary calculi: Secondary | ICD-10-CM | POA: Diagnosis not present

## 2022-03-15 DIAGNOSIS — K625 Hemorrhage of anus and rectum: Secondary | ICD-10-CM | POA: Diagnosis present

## 2022-03-15 DIAGNOSIS — Z1152 Encounter for screening for COVID-19: Secondary | ICD-10-CM | POA: Diagnosis not present

## 2022-03-15 LAB — RESPIRATORY PANEL BY PCR

## 2022-03-15 LAB — BASIC METABOLIC PANEL
Anion gap: 8 (ref 5–15)
BUN: 8 mg/dL (ref 8–23)
CO2: 25 mmol/L (ref 22–32)
Calcium: 8.5 mg/dL — ABNORMAL LOW (ref 8.9–10.3)
Chloride: 109 mmol/L (ref 98–111)
Creatinine, Ser: 0.58 mg/dL (ref 0.44–1.00)
GFR, Estimated: >60 mL/min (ref >60)
Glucose, Bld: 100 mg/dL — ABNORMAL HIGH (ref 70–99)
Potassium: 4 mmol/L (ref 3.5–5.1)
Sodium: 142 mmol/L (ref 135–145)

## 2022-03-15 LAB — CBC
HCT: 36.8 % (ref 36.0–46.0)
Hemoglobin: 12 g/dL (ref 12.0–15.0)
MCH: 31.7 pg (ref 26.0–34.0)
MCHC: 32.6 g/dL (ref 30.0–36.0)
MCV: 97.4 fL (ref 80.0–100.0)
Platelets: 191 10*3/uL (ref 150–400)
RBC: 3.78 MIL/uL — ABNORMAL LOW (ref 3.87–5.11)
RDW: 12.9 % (ref 11.5–15.5)
WBC: 5.8 10*3/uL (ref 4.0–10.5)
nRBC: 0 % (ref 0.0–0.2)

## 2022-03-15 LAB — HEMOGLOBIN AND HEMATOCRIT, BLOOD
HCT: 37.8 % (ref 36.0–46.0)
HCT: 37.9 % (ref 36.0–46.0)
Hemoglobin: 12.3 g/dL (ref 12.0–15.0)
Hemoglobin: 12.1 g/dL (ref 12.0–15.0)

## 2022-03-15 MED ORDER — PROCHLORPERAZINE EDISYLATE 10 MG/2ML IJ SOLN
5.0000 mg | Freq: Once | INTRAMUSCULAR | Status: AC | PRN
  Administered 2022-03-15: 5 mg via INTRAVENOUS
  Filled 2022-03-15: qty 2

## 2022-03-15 MED ORDER — GUAIFENESIN-DM 100-10 MG/5ML PO SYRP
5.0000 mL | ORAL_SOLUTION | ORAL | Status: DC | PRN
  Administered 2022-03-15: 5 mL via ORAL
  Filled 2022-03-15: qty 5

## 2022-03-15 NOTE — Progress Notes (Addendum)
PROGRESS NOTE    Theresa Duran  ZOX:[id number] DOB: [birth year] DOA: 03/14/2022 PCP: Alcus Dad, MD     Brief Narrative:  Theresa Duran is a 76 y.o. female with medical history significant of hypertension, hyperlipidemia who presents to the hospital with bright red blood per rectum.  Patient daughter is at bedside who translates.  She states that around 8 PM last night, patient started having blood mixed with stool.  Also admits to some lower abdominal pain.  Since then, she has had more than 10 episodes of blood per rectum.  Never had this before.  Denies using any blood thinners, aspirin or any ibuprofen use.  No fevers at home.  Has had a dry cough over the last 2 weeks.  Denies any shortness of breath or chest pain.  No nausea or vomiting.  No other source of bleeding including vaginal bleeding or vomiting blood.  CT abdomen pelvis revealed nonspecific colitis in the distal descending colon, without diverticular disease, perforation or abscess. Patient was admitted and GI consulted.  New events last 24 hours / Subjective: Patient has had couple more episodes of bloody bowel movement.  Also admits to being dizzy.  Episode of vomiting this morning  Assessment & Plan:   Principal Problem:   GI bleed Active Problems:   Hyperlipidemia   Essential hypertension, benign   Hypokalemia   Hematochezia, colitis -CT abdomen pelvis showing nonspecific colitis in the descending colon  -Hemoglobin remains stable, continue to monitor H&H -GI consulted -Record shows that patient underwent colonoscopy in 12/01/2020 by Dr. Carlean Purl and at that time, it showed polyps, external and internal hemorrhoids -Cipro/Flagyl -IV fluid -Clear liquid diet   Hypertension -Norvasc   Hyperlipidemia -Crestor     DVT prophylaxis:  SCDs Start: 03/14/22 1726  Code Status: Full code Family Communication: No family at bedside, updated daughter over phone today  Disposition Plan:  Status is: Observation The  patient will require care spanning > 2 midnights and should be moved to inpatient because: IV antibiotics, continue to monitor on clear liquid diet, GI consult pending  Antimicrobials:  Anti-infectives (From admission, onward)    Start     Dose/Rate Route Frequency Ordered Stop   03/14/22 1815  metroNIDAZOLE (FLAGYL) IVPB 500 mg        500 mg 100 mL/hr over 60 Minutes Intravenous Every 12 hours 03/14/22 1726     03/14/22 1800  ciprofloxacin (CIPRO) IVPB 400 mg        400 mg 200 mL/hr over 60 Minutes Intravenous Every 12 hours 03/14/22 1700          Objective: Vitals:   03/14/22 1723 03/14/22 2203 03/15/22 0050 03/15/22 0542  BP: 122/65 (!) 108/46 (!) 103/51 (!) 115/59  Pulse: 82 77 77 69  Resp: '20 19 19 15  '$ Temp: 97.7 F (36.5 C) 98.6 F (37 C) 98.7 F (37.1 C) 98.4 F (36.9 C)  TempSrc: Oral   Oral  SpO2: 97% 91% 93% 94%  Weight: 68.7 kg     Height: '4\' 5"'$  (1.346 m)       Intake/Output Summary (Last 24 hours) at 03/15/2022 1057 Last data filed at 03/15/2022 0510 Gross per 24 hour  Intake 669.03 ml  Output 450 ml  Net 219.03 ml   Filed Weights   03/14/22 1145 03/14/22 1723  Weight: 70.3 kg 68.7 kg    Examination:  General exam: Appears calm and comfortable  Respiratory system: Clear to auscultation. Respiratory effort normal. No respiratory distress. No  conversational dyspnea.  Cardiovascular system: S1 & S2 heard, RRR. No murmurs. No pedal edema. Gastrointestinal system: Abdomen is nondistended, soft and mildly tender to palpation periumbilical.  Bowel sounds heard Central nervous system: Alert and oriented. No focal neurological deficits. Speech clear.  Extremities: Symmetric in appearance  Skin: No rashes, lesions or ulcers on exposed skin  Psychiatry: Judgement and insight appear normal. Mood & affect appropriate.   Data Reviewed: I have personally reviewed following labs and imaging studies  CBC: Recent Labs  Lab 03/14/22 1218 03/14/22 1746  03/15/22 0123 03/15/22 0905  WBC 7.9  --  5.8  --   NEUTROABS 5.2  --   --   --   HGB 13.7 13.3 12.0 12.3  HCT 41.5 40.2 36.8 37.8  MCV 96.5  --  97.4  --   PLT 239  --  191  --    Basic Metabolic Panel: Recent Labs  Lab 03/14/22 1218 03/15/22 0132  NA 142 142  K 3.2* 4.0  CL 106 109  CO2 26 25  GLUCOSE 122* 100*  BUN 13 8  CREATININE 0.81 0.58  CALCIUM 9.3 8.5*   GFR: Estimated Creatinine Clearance: 43.3 mL/min (by C-G formula based on SCr of 0.58 mg/dL). Liver Function Tests: Recent Labs  Lab 03/14/22 1218  AST 24  ALT 26  ALKPHOS 49  BILITOT 0.8  PROT 7.5  ALBUMIN 4.0   Recent Labs  Lab 03/14/22 1218  LIPASE 38   No results for input(s): "AMMONIA" in the last 168 hours. Coagulation Profile: No results for input(s): "INR", "PROTIME" in the last 168 hours. Cardiac Enzymes: No results for input(s): "CKTOTAL", "CKMB", "CKMBINDEX", "TROPONINI" in the last 168 hours. BNP (last 3 results) No results for input(s): "PROBNP" in the last 8760 hours. HbA1C: No results for input(s): "HGBA1C" in the last 72 hours. CBG: No results for input(s): "GLUCAP" in the last 168 hours. Lipid Profile: No results for input(s): "CHOL", "HDL", "LDLCALC", "TRIG", "CHOLHDL", "LDLDIRECT" in the last 72 hours. Thyroid Function Tests: No results for input(s): "TSH", "T4TOTAL", "FREET4", "T3FREE", "THYROIDAB" in the last 72 hours. Anemia Panel: No results for input(s): "VITAMINB12", "FOLATE", "FERRITIN", "TIBC", "IRON", "RETICCTPCT" in the last 72 hours. Sepsis Labs: No results for input(s): "PROCALCITON", "LATICACIDVEN" in the last 168 hours.  Recent Results (from the past 240 hour(s))  SARS Coronavirus 2 by RT PCR (hospital order, performed in [facility] hospital lab) *cepheid single result test* Anterior Nasal Swab     Status: None   Collection Time: 03/14/22  7:51 PM   Specimen: Anterior Nasal Swab  Result Value Ref Range Status   SARS Coronavirus 2 by RT PCR NEGATIVE NEGATIVE  Final    Comment: (NOTE) SARS-CoV-2 target nucleic acids are NOT DETECTED.  The SARS-CoV-2 RNA is generally detectable in upper and lower respiratory specimens during the acute phase of infection. The lowest concentration of SARS-CoV-2 viral copies this assay can detect is 250 copies / mL. A negative result does not preclude SARS-CoV-2 infection and should not be used as the sole basis for treatment or other patient management decisions.  A negative result may occur with improper specimen collection / handling, submission of specimen other than nasopharyngeal swab, presence of viral mutation(s) within the areas targeted by this assay, and inadequate number of viral copies (<250 copies / mL). A negative result must be combined with clinical observations, patient history, and epidemiological information.  Fact Sheet for Patients:   https://www.patel.info/  Fact Sheet for Healthcare Providers: https://hall.com/  This test is not yet approved or  cleared by the [place] and has been authorized for detection and/or diagnosis of SARS-CoV-2 by FDA under an Emergency Use Authorization (EUA).  This EUA will remain in effect (meaning this test can be used) for the duration of the COVID-19 declaration under Section 564(b)(1) of the Act, 21 U.S.C. section 360bbb-3(b)(1), unless the authorization is terminated or revoked sooner.  Performed at [facility], [place] [address], [place], [place] [address]   Respiratory (~20 pathogens) panel by PCR     Status: None   Collection Time: 03/14/22  7:51 PM   Specimen: Nasopharyngeal Swab; Respiratory  Result Value Ref Range Status   Adenovirus NOT DETECTED NOT DETECTED Final   Coronavirus 229E NOT DETECTED NOT DETECTED Final    Comment: (NOTE) The Coronavirus on the Respiratory Panel, DOES NOT test for the novel  Coronavirus (2019 nCoV)    Coronavirus HKU1 NOT DETECTED NOT  DETECTED Final   Coronavirus NL63 NOT DETECTED NOT DETECTED Final   Coronavirus OC43 NOT DETECTED NOT DETECTED Final   Metapneumovirus NOT DETECTED NOT DETECTED Final   Rhinovirus / Enterovirus NOT DETECTED NOT DETECTED Final   Influenza A NOT DETECTED NOT DETECTED Final   Influenza B NOT DETECTED NOT DETECTED Final   Parainfluenza Virus 1 NOT DETECTED NOT DETECTED Final   Parainfluenza Virus 2 NOT DETECTED NOT DETECTED Final   Parainfluenza Virus 3 NOT DETECTED NOT DETECTED Final   Parainfluenza Virus 4 NOT DETECTED NOT DETECTED Final   Respiratory Syncytial Virus NOT DETECTED NOT DETECTED Final   Bordetella pertussis NOT DETECTED NOT DETECTED Final   Bordetella Parapertussis NOT DETECTED NOT DETECTED Final   Chlamydophila pneumoniae NOT DETECTED NOT DETECTED Final   Mycoplasma pneumoniae NOT DETECTED NOT DETECTED Final    Comment: Performed at [facility], [place]. [address], [place], [place] [address]      Radiology Studies: CT ABDOMEN PELVIS W CONTRAST  Result Date: 03/14/2022 CLINICAL DATA:  Left lower quadrant pain. Rectal bleeding. Suspected diverticulitis. EXAM: CT ABDOMEN AND PELVIS WITH CONTRAST TECHNIQUE: Multidetector CT imaging of the abdomen and pelvis was performed using the standard protocol following bolus administration of intravenous contrast. RADIATION DOSE REDUCTION: This exam was performed according to the departmental dose-optimization program which includes automated exposure control, adjustment of the mA and/or kV according to patient size and/or use of iterative reconstruction technique. CONTRAST:  173m OMNIPAQUE IOHEXOL 300 MG/ML  SOLN COMPARISON:  11/04/2015 FINDINGS: Lower Chest: No acute findings. Hepatobiliary: No hepatic masses identified. Tiny calcified gallstone is again noted, however there is no evidence of cholecystitis or biliary ductal dilatation. Pancreas:  No mass or inflammatory changes. Spleen: Within normal limits in size and appearance.  Adrenals/Urinary Tract: No suspicious masses identified. 3 mm calculus noted in lower pole of left kidney. No evidence of ureteral calculi or hydronephrosis. Unremarkable unopacified urinary bladder. Stomach/Bowel: Normal appendix visualized. Mild wall thickening is seen involving the distal descending colon mild pericolonic soft tissue stranding. There is no evidence of diverticular disease in this area. No evidence of perforation, abscess, or bowel obstruction. Vascular/Lymphatic: No pathologically enlarged lymph nodes. No acute vascular findings. Aortic atherosclerotic calcification incidentally noted. Reproductive: Prior hysterectomy noted. Adnexal regions are unremarkable in appearance. Other:  None. Musculoskeletal:  No suspicious bone lesions identified. IMPRESSION: Mild nonspecific colitis involving the distal descending colon. No evidence of diverticular disease, perforation, or abscess. Cholelithiasis. No radiographic evidence of cholecystitis. Tiny left renal calculus. No evidence of ureteral calculi  or hydronephrosis. Aortic Atherosclerosis (ICD10-I70.0). Electronically Signed   By: Marlaine Hind M.D.   On: 03/14/2022 15:08      Scheduled Meds:  amLODipine  5 mg Oral QHS   rosuvastatin  5 mg Oral QHS   sodium chloride flush  3 mL Intravenous Q12H   Continuous Infusions:  sodium chloride 75 mL/hr at 03/14/22 1737   ciprofloxacin 400 mg (03/15/22 0510)   metronidazole 500 mg (03/15/22 0655)     LOS: 0 days     Dessa Phi, DO [facility] 03/15/2022, 10:57 AM   Available via Epic secure chat 7am-7pm After these hours, please refer to coverage provider listed on [email or web address]

## 2022-03-15 NOTE — Progress Notes (Addendum)
Consultation Note   Referring Provider:  [facility] PCP: Alcus Dad, MD Primary Gastroenterologist: Silvano Rusk, MD  Reason for consultation: Rectal bleeding  Hospital Day: 2  Assessment    Patient profile:  Theresa Duran is a 76 y.o. female with a past medical history significant for colon polyps, HTN, HLD, cholelithiasis, kidney stones  . See PMH for any additional medical problems. Admitted last night for rectal bleeding.and CT scan showing  mild nonspecific colitis involving the distal descending colon.  . # Rectal bleeding, lower abdominal discomfort and mild distal descending colon colitis on CT scan . Etiology unclear.  I would have expected some diarrhea if  she had infectious colitis but she adamantly denies any recent diarrhea. Diverticular hemorrhage doesn't fit. Ischemic colitis?  Normal WBC. Hgb 12.3 Only 2 episodes of small volume rectal bleeding last night, none this am.  She did have an episode of vomiting this am  # See PMH for additional medical problems  Plan   Continue clear liquids for now. If no further vomiting may advance diet later today Continue Cipro / flagyl for now.  Continue anti-emetics as needed Hopefully home tomorrow  GI attending:  I have seen and evaluated the patient and reviewed CT images. Son present and assists w/ language barrier. Most likely ischemic colitis. Abd benign now.  She did have some epigastric pain, self-limited, a few weeks ago.  No bleeding today. Wants food.  Of note son said she moved in w/ his sister a few months ago - he appeared distraught about this and asked if there were any signs of poisoning. He could not elaborate as to why but is distraught over mother moving out of his house and in with sister.  I think if she tolerates diet can go home tomorrow and would not Rx Abx as outpatient and we can arrange GI f/u in office  Gatha Mayer, MD, [facility]  Gastroenterology See Shea Evans on call - gastroenterology for best contact person 03/15/2022 3:09 PM       HPI   **Interpreter used for consultation  Theresa Duran felt urge to have a BM but only passed red blood. She had about 10 episodes before coming to ED. She had associated dizziness and also associated generalized lower abdominal discomfort. No nausea yesterday but had an episode of vomiting this am.  She cannot recall when her last actual BM was She hasn't had any recent diarrhea. No triptan use  Previous GI Evaluation     August 2022 polyp surveillance colonoscopy -Three diminutive polyps in the sigmoid colon, in the ascending colon and in the cecum, removed with a cold snare. Resected and retrieved. - External and internal hemorrhoids. - The examination was otherwise normal. - Personal history of colonic polyp diminutive adenoma 2015. Path - tubular adenomas. No recall due to age  Recent Labs and Imaging CT ABDOMEN PELVIS W CONTRAST  Result Date: 03/14/2022 CLINICAL DATA:  Left lower quadrant pain. Rectal bleeding. Suspected diverticulitis. EXAM: CT ABDOMEN AND PELVIS WITH CONTRAST TECHNIQUE: Multidetector CT imaging of the abdomen and pelvis was performed using the standard protocol following bolus administration of intravenous contrast. RADIATION DOSE REDUCTION: This exam was performed according to the departmental dose-optimization program which includes  automated exposure control, adjustment of the mA and/or kV according to patient size and/or use of iterative reconstruction technique. CONTRAST:  136m OMNIPAQUE IOHEXOL 300 MG/ML  SOLN COMPARISON:  11/04/2015 FINDINGS: Lower Chest: No acute findings. Hepatobiliary: No hepatic masses identified. Tiny calcified gallstone is again noted, however there is no evidence of cholecystitis or biliary ductal dilatation. Pancreas:  No mass or inflammatory changes. Spleen: Within normal limits in size and appearance. Adrenals/Urinary Tract: No  suspicious masses identified. 3 mm calculus noted in lower pole of left kidney. No evidence of ureteral calculi or hydronephrosis. Unremarkable unopacified urinary bladder. Stomach/Bowel: Normal appendix visualized. Mild wall thickening is seen involving the distal descending colon mild pericolonic soft tissue stranding. There is no evidence of diverticular disease in this area. No evidence of perforation, abscess, or bowel obstruction. Vascular/Lymphatic: No pathologically enlarged lymph nodes. No acute vascular findings. Aortic atherosclerotic calcification incidentally noted. Reproductive: Prior hysterectomy noted. Adnexal regions are unremarkable in appearance. Other:  None. Musculoskeletal:  No suspicious bone lesions identified. IMPRESSION: Mild nonspecific colitis involving the distal descending colon. No evidence of diverticular disease, perforation, or abscess. Cholelithiasis. No radiographic evidence of cholecystitis. Tiny left renal calculus. No evidence of ureteral calculi or hydronephrosis. Aortic Atherosclerosis (ICD10-I70.0). Electronically Signed   By: JMarlaine HindM.D.   On: 03/14/2022 15:08    Labs:  Recent Labs    03/14/22 1218 03/14/22 1746 03/15/22 0123 03/15/22 0905  WBC 7.9  --  5.8  --   HGB 13.7 13.3 12.0 12.3  HCT 41.5 40.2 36.8 37.8  PLT 239  --  191  --    Recent Labs    03/14/22 1218 03/15/22 0132  NA 142 142  K 3.2* 4.0  CL 106 109  CO2 26 25  GLUCOSE 122* 100*  BUN 13 8  CREATININE 0.81 0.58  CALCIUM 9.3 8.5*   Recent Labs    03/14/22 1218  PROT 7.5  ALBUMIN 4.0  AST 24  ALT 26  ALKPHOS 49  BILITOT 0.8   No results for input(s): "HEPBSAG", "HCVAB", "HEPAIGM", "HEPBIGM" in the last 72 hours. No results for input(s): "LABPROT", "INR" in the last 72 hours.  Past Medical History:  Diagnosis Date   Arthritis    HANDS , LEGS   Cataract    REMOVED BOTH EYES   Hyperlipidemia    Hypertension    Kidney stone    Personal history of colonic  polyps-adenoma 05/14/2013    Past Surgical History:  Procedure Laterality Date   ABDOMINAL HYSTERECTOMY     total vaginal with BSO, secondary to prolapse   COLONOSCOPY      Family History  Problem Relation Age of Onset   Cancer Sister        Breast   Hypotension Sister    Hypertension Neg Hx    Heart disease Neg Hx    Colon cancer Neg Hx    Colon polyps Neg Hx    Pancreatic cancer Neg Hx    Stomach cancer Neg Hx    Rectal cancer Neg Hx     Prior to Admission medications   Medication Sig Start Date End Date Taking? Authorizing Provider  alendronate (FOSAMAX) 70 MG tablet TAKE 1 TABLET BY MOUTH WEEKLY  WITH 8 OZ OF PLAIN WATER 30  MINUTES BEFORE FIRST FOOD, DRINK OR MEDS. STAY UPRIGHT FOR 30  MINS Patient taking differently: Take 70 mg by mouth See admin instructions. TAKE 70 MG (1 TABLET) BY MOUTH ON SUNDAYS WITH 8 OZ OF  PLAIN WATER 30 MINUTES BEFORE FIRST FOOD, DRINK OR MEDS. STAY UPRIGHT FOR 30  MINS. 11/13/21  Yes Alcus Dad, MD  amLODipine (NORVASC) 5 MG tablet TAKE 1 TABLET BY MOUTH AT  BEDTIME Patient taking differently: Take 5 mg by mouth daily. 11/17/21  Yes Alcus Dad, MD  Cholecalciferol (VITAMIN D3 PO) Take 1 capsule by mouth daily.   Yes [provider]  Dextromethorphan-guaiFENesin (DELSYM COUGH/CHEST CONGEST DM) 5-100 MG/5ML LIQD Take 10 mLs by mouth every 12 (twelve) hours as needed (for coughing and/or congestion).   Yes [provider]  Multiple Vitamins-Minerals (CENTRUM SILVER PO) Take 1 tablet by mouth daily with breakfast.   Yes [provider]  rosuvastatin (CRESTOR) 5 MG tablet TAKE 1 TABLET BY MOUTH DAILY Patient taking differently: Take 5 mg by mouth at bedtime. 11/17/21  Yes Alcus Dad, MD  diclofenac Sodium (VOLTAREN) 1 % GEL Apply 2 g topically 3 (three) times daily. Patient not taking: Reported on 03/14/2022 08/11/21   Alcus Dad, MD    Current Facility-Administered Medications  Medication Dose Route Frequency  Provider Last Rate Last Admin   0.9 %  sodium chloride infusion   Intravenous Continuous Dessa Phi, DO 75 mL/hr at 03/14/22 1737 New Bag at 03/14/22 1737   acetaminophen (TYLENOL) tablet 650 mg  650 mg Oral Q6H PRN Dessa Phi, DO   650 mg at 03/14/22 2114   Or   acetaminophen (TYLENOL) suppository 650 mg  650 mg Rectal Q6H PRN Dessa Phi, DO       amLODipine (NORVASC) tablet 5 mg  5 mg Oral QHS Dessa Phi, DO   5 mg at 03/14/22 2114   benzonatate (TESSALON) capsule 100 mg  100 mg Oral BID PRN Dessa Phi, DO   100 mg at 03/14/22 2114   ciprofloxacin (CIPRO) IVPB 400 mg  400 mg Intravenous Q12H Tawnya Crook, RPH 200 mL/hr at 03/15/22 0510 400 mg at 03/15/22 0510   HYDROcodone-acetaminophen (NORCO/VICODIN) 5-325 MG per tablet 1-2 tablet  1-2 tablet Oral Q4H PRN Dessa Phi, DO   1 tablet at 03/15/22 4097   metroNIDAZOLE (FLAGYL) IVPB 500 mg  500 mg Intravenous Q12H Dessa Phi, DO 100 mL/hr at 03/15/22 0655 500 mg at 03/15/22 0655   ondansetron (ZOFRAN) tablet 4 mg  4 mg Oral Q6H PRN Dessa Phi, DO       Or   ondansetron [facility]) injection 4 mg  4 mg Intravenous Q6H PRN Dessa Phi, DO   4 mg at 03/15/22 3532   rosuvastatin (CRESTOR) tablet 5 mg  5 mg Oral QHS Dessa Phi, DO   5 mg at 03/14/22 2114   sodium chloride flush (NS) 0.9 % injection 3 mL  3 mL Intravenous Q12H Dessa Phi, DO   3 mL at 03/14/22 2115    Allergies as of 03/14/2022   (No Known Allergies)    Social History   Socioeconomic History   Marital status: Single    Spouse name: Not on file   Number of children: 2   Years of education: 5th grade   Highest education level: 5th grade  Occupational History   Not on file  Tobacco Use   Smoking status: Never   Smokeless tobacco: Never  Vaping Use   Vaping Use: Never used  Substance and Sexual Activity   Alcohol use: No   Drug use: No   Sexual activity: Not Currently  Other Topics Concern   Not on file  Social History  Narrative   Lives with  daughter Larrie Kass, speaks vietnamese. Retired and single.   Social Determinants of Health   Financial Resource Strain: Low Risk  (12/02/2020)   Overall Financial Resource Strain (CARDIA)    Difficulty of Paying Living Expenses: Not hard at all  Food Insecurity: No Food Insecurity (12/02/2020)   Hunger Vital Sign    Worried About Running Out of Food in the Last Year: Never true    Ran Out of Food in the Last Year: Never true  Transportation Needs: No Transportation Needs (12/02/2020)   PRAPARE - Hydrologist (Medical): No    Lack of Transportation (Non-Medical): No  Physical Activity: Insufficiently Active (12/02/2020)   Exercise Vital Sign    Days of Exercise per Week: 4 days    Minutes of Exercise per Session: 30 min  Stress: No Stress Concern Present (12/02/2020)   [place]    Feeling of Stress : Not at all  Social Connections: Socially Isolated (12/02/2020)   Social Connection and Isolation Panel [NHANES]    Frequency of Communication with Friends and Family: Once a week    Frequency of Social Gatherings with Friends and Family: Never    Attends Religious Services: 1 to 4 times per year    Active Member of [employer] or Organizations: No    Attends Archivist Meetings: Never    Marital Status: Divorced  Human resources officer Violence: Not At Risk (12/02/2020)   Humiliation, Afraid, Rape, and Kick questionnaire    Fear of Current or Ex-Partner: No    Emotionally Abused: No    Physically Abused: No    Sexually Abused: No    Review of Systems: All systems reviewed and negative except where noted in HPI.  Physical Exam: Vital signs in last 24 hours: Temp:  [97.6 F (36.4 C)-98.7 F (37.1 C)] 98.4 F (36.9 C) (12/04 0542) Pulse Rate:  [69-100] 69 (12/04 0542) Resp:  [14-20] 15 (12/04 0542) BP: (103-144)/(46-76) 115/59 (12/04 0542) SpO2:  [91 %-98 %] 94 %  (12/04 0542) Weight:  [68.7 kg-70.3 kg] 68.7 kg (12/03 1723) Last BM Date : 03/14/22  General:  Alert female in NAD Psych:  Pleasant, cooperative. Normal mood and affect Eyes: Pupils equal Ears:  Normal auditory acuity Nose: No deformity, discharge or lesions Neck:  Supple, no masses felt Lungs:  Clear to auscultation.  Heart:  Regular rate, regular rhythm. No lower extremity edema Abdomen:  Soft, nondistended, nontender, active bowel sounds, no masses felt Rectal :  Perianal hemorrhoid tags. No stool or blood in vault. No masses felt Msk: Symmetrical without gross deformities.  Neurologic:  Alert, oriented, grossly normal neurologically Skin:  Intact without significant lesions.    Intake/Output from previous day: 12/03 0701 - 12/04 0700 In: 669 [I.V.:669] Out: 450 [Urine:450] Intake/Output this shift:  No intake/output data recorded.    Principal Problem:   GI bleed Active Problems:   Hyperlipidemia   Essential hypertension, benign   Hypokalemia    Tye Savoy, NP-C @  03/15/2022, 9:44 AM

## 2022-03-15 NOTE — Progress Notes (Signed)
  Transition of Care [facility]) Screening Note   Patient Details  Name: Theresa Duran Date of Birth: 1945-08-13   Transition of Care [facility]) CM/SW Contact:    Vassie Moselle, LCSW Phone Number: 03/15/2022, 11:05 AM    Transition of Care Department [facility]) has reviewed patient and no TOC needs have been identified at this time. We will continue to monitor patient advancement through interdisciplinary progression rounds. If new patient transition needs arise, please place a TOC consult.

## 2022-03-15 NOTE — Progress Notes (Signed)
PHARMACY NOTE -  Ciprofloxacin  Pharmacy has been assisting with dosing of ciprofloxacin for suspected colitis.  Dosage remains stable at 400 mg IV q12h  and need for further dosage adjustment appears unlikely at present.    Will sign off at this time.  Please reconsult if a change in clinical status warrants re-evaluation of dosage.  Dia Sitter, PharmD, BCPS 03/15/2022 12:22 PM

## 2022-03-16 LAB — CBC
HCT: 35.1 % — ABNORMAL LOW (ref 36.0–46.0)
Hemoglobin: 11.5 g/dL — ABNORMAL LOW (ref 12.0–15.0)
MCH: 32 pg (ref 26.0–34.0)
MCHC: 32.8 g/dL (ref 30.0–36.0)
MCV: 97.8 fL (ref 80.0–100.0)
Platelets: 187 10*3/uL (ref 150–400)
RBC: 3.59 MIL/uL — ABNORMAL LOW (ref 3.87–5.11)
RDW: 12.9 % (ref 11.5–15.5)
WBC: 5.5 10*3/uL (ref 4.0–10.5)
nRBC: 0 % (ref 0.0–0.2)

## 2022-03-16 LAB — HEMOGLOBIN AND HEMATOCRIT, BLOOD
HCT: 38.3 % (ref 36.0–46.0)
HCT: 35.8 % — ABNORMAL LOW (ref 36.0–46.0)
Hemoglobin: 12.6 g/dL (ref 12.0–15.0)
Hemoglobin: 11.6 g/dL — ABNORMAL LOW (ref 12.0–15.0)

## 2022-03-16 MED ORDER — ALUM & MAG HYDROXIDE-SIMETH 200-200-20 MG/5ML PO SUSP
30.0000 mL | ORAL | Status: DC | PRN
  Administered 2022-03-16: 30 mL via ORAL
  Filled 2022-03-16: qty 30

## 2022-03-16 MED ORDER — BENZONATATE 100 MG PO CAPS: 100.0000 mg | ORAL_CAPSULE | Freq: Two times a day (BID) | ORAL | 0 refills | Status: AC | PRN

## 2022-03-16 NOTE — Discharge Summary (Signed)
Physician Discharge Summary  Theresa Duran VHQ:[id number] DOB: [birth year] DOA: 03/14/2022  PCP: Alcus Dad, MD  Admit date: 03/14/2022 Discharge date: 03/16/2022  Admitted From: Home Disposition:  Home  Recommendations for Outpatient Follow-up:  Follow up with PCP and GI  Discharge Condition: Stable CODE STATUS: Full  Diet recommendation: Regular   Brief/Interim Summary: Theresa Duran is a 76 y.o. female with medical history significant of hypertension, hyperlipidemia who presents to the hospital with bright red blood per rectum.  Patient daughter is at bedside who translates.  She states that around 8 PM last night, patient started having blood mixed with stool.  Also admits to some lower abdominal pain.  Since then, she has had more than 10 episodes of blood per rectum.  Never had this before.  Denies using any blood thinners, aspirin or any ibuprofen use.  No fevers at home.  Has had a dry cough over the last 2 weeks.  Denies any shortness of breath or chest pain.  No nausea or vomiting.  No other source of bleeding including vaginal bleeding or vomiting blood.  CT abdomen pelvis revealed nonspecific colitis in the distal descending colon, without diverticular disease, perforation or abscess. Patient was admitted and GI consulted.   Discharge Diagnoses:   Principal Problem:   GI bleed Active Problems:   Hyperlipidemia   Essential hypertension, benign   Hypokalemia   Ischemic colitis (HCC)   Hematochezia, colitis -CT abdomen pelvis showing nonspecific colitis in the descending colon  -Hemoglobin remains stable -GI consulted -Record shows that patient underwent colonoscopy in 12/01/2020 by Dr. Carlean Purl and at that time, it showed polyps, external and internal hemorrhoids -Felt to be secondary to non-occlusive acute ischemic colitis -Follow up with GI outpatient    Hypertension -Norvasc   Hyperlipidemia -Crestor   Cough -Tessalon pearls   Discharge  Instructions  Discharge Instructions     Call MD for:  difficulty breathing, headache or visual disturbances   Complete by: As directed    Call MD for:  extreme fatigue   Complete by: As directed    Call MD for:  persistant dizziness or light-headedness   Complete by: As directed    Call MD for:  persistant nausea and vomiting   Complete by: As directed    Call MD for:  severe uncontrolled pain   Complete by: As directed    Call MD for:  temperature >100.4   Complete by: As directed    Discharge instructions   Complete by: As directed    You were cared for by a hospitalist during your hospital stay. If you have any questions about your discharge medications or the care you received while you were in the hospital after you are discharged, you can call the unit and ask to speak with the hospitalist on call if the hospitalist that took care of you is not available. Once you are discharged, your primary care physician will handle any further medical issues. Please note that NO REFILLS for any discharge medications will be authorized once you are discharged, as it is imperative that you return to your primary care physician (or establish a relationship with a primary care physician if you do not have one) for your aftercare needs so that they can reassess your need for medications and monitor your lab values.   Increase activity slowly   Complete by: As directed       Allergies as of 03/16/2022   No Known Allergies  Medication List     STOP taking these medications    diclofenac Sodium 1 % Gel Commonly known as: Voltaren       TAKE these medications    alendronate 70 MG tablet Commonly known as: FOSAMAX TAKE 1 TABLET BY MOUTH WEEKLY  WITH 8 OZ OF PLAIN WATER 30  MINUTES BEFORE FIRST FOOD, DRINK OR MEDS. STAY UPRIGHT FOR 30  MINS What changed:  how much to take how to take this when to take this additional instructions   amLODipine 5 MG tablet Commonly known as:  NORVASC TAKE 1 TABLET BY MOUTH AT  BEDTIME What changed: when to take this   benzonatate 100 MG capsule Commonly known as: TESSALON Take 1 capsule (100 mg total) by mouth 2 (two) times daily as needed for cough.   CENTRUM SILVER PO Take 1 tablet by mouth daily with breakfast.   Delsym Cough/Chest Congest DM 5-100 MG/5ML Liqd Generic drug: Dextromethorphan-guaiFENesin Take 10 mLs by mouth every 12 (twelve) hours as needed (for coughing and/or congestion).   rosuvastatin 5 MG tablet Commonly known as: CRESTOR TAKE 1 TABLET BY MOUTH DAILY What changed: when to take this   VITAMIN D3 PO Take 1 capsule by mouth daily.        Follow-up Information     Alcus Dad, MD Follow up.   Specialty: Family Medicine Contact information: [place] [address] [phone]         Gatha Mayer, MD Follow up.   Specialty: Gastroenterology Contact information: [address] [address] [phone]                No Known Allergies  Consultations: GI   Procedures/Studies: CT ABDOMEN PELVIS W CONTRAST  Result Date: 03/14/2022 CLINICAL DATA:  Left lower quadrant pain. Rectal bleeding. Suspected diverticulitis. EXAM: CT ABDOMEN AND PELVIS WITH CONTRAST TECHNIQUE: Multidetector CT imaging of the abdomen and pelvis was performed using the standard protocol following bolus administration of intravenous contrast. RADIATION DOSE REDUCTION: This exam was performed according to the departmental dose-optimization program which includes automated exposure control, adjustment of the mA and/or kV according to patient size and/or use of iterative reconstruction technique. CONTRAST:  154m OMNIPAQUE IOHEXOL 300 MG/ML  SOLN COMPARISON:  11/04/2015 FINDINGS: Lower Chest: No acute findings. Hepatobiliary: No hepatic masses identified. Tiny calcified gallstone is again noted, however there is no evidence of cholecystitis or biliary ductal dilatation.  Pancreas:  No mass or inflammatory changes. Spleen: Within normal limits in size and appearance. Adrenals/Urinary Tract: No suspicious masses identified. 3 mm calculus noted in lower pole of left kidney. No evidence of ureteral calculi or hydronephrosis. Unremarkable unopacified urinary bladder. Stomach/Bowel: Normal appendix visualized. Mild wall thickening is seen involving the distal descending colon mild pericolonic soft tissue stranding. There is no evidence of diverticular disease in this area. No evidence of perforation, abscess, or bowel obstruction. Vascular/Lymphatic: No pathologically enlarged lymph nodes. No acute vascular findings. Aortic atherosclerotic calcification incidentally noted. Reproductive: Prior hysterectomy noted. Adnexal regions are unremarkable in appearance. Other:  None. Musculoskeletal:  No suspicious bone lesions identified. IMPRESSION: Mild nonspecific colitis involving the distal descending colon. No evidence of diverticular disease, perforation, or abscess. Cholelithiasis. No radiographic evidence of cholecystitis. Tiny left renal calculus. No evidence of ureteral calculi or hydronephrosis. Aortic Atherosclerosis (ICD10-I70.0). Electronically Signed   By: JMarlaine HindM.D.   On: 03/14/2022 15:08       Discharge Exam: Vitals:   03/15/22 2054 03/16/22 01287  BP: (!) 114/56 101/63  Pulse: 81 64  Resp: 18 17  Temp: 98.5 F (36.9 C) 98.4 F (36.9 C)  SpO2: 94% 92%    General: Pt is alert, awake, not in acute distress Cardiovascular: RRR, S1/S2 +, no edema Respiratory: CTA bilaterally, no wheezing, no rhonchi, no respiratory distress, no conversational dyspnea  Abdominal: Soft, NT, ND, bowel sounds + Extremities: no edema, no cyanosis Psych: Normal mood and affect, stable judgement and insight     The results of significant diagnostics from this hospitalization (including imaging, microbiology, ancillary and laboratory) are listed below for reference.      Microbiology: Recent Results (from the past 240 hour(s))  SARS Coronavirus 2 by RT PCR (hospital order, performed in [facility] hospital lab) *cepheid single result test* Anterior Nasal Swab     Status: None   Collection Time: 03/14/22  7:51 PM   Specimen: Anterior Nasal Swab  Result Value Ref Range Status   SARS Coronavirus 2 by RT PCR NEGATIVE NEGATIVE Final    Comment: (NOTE) SARS-CoV-2 target nucleic acids are NOT DETECTED.  The SARS-CoV-2 RNA is generally detectable in upper and lower respiratory specimens during the acute phase of infection. The lowest concentration of SARS-CoV-2 viral copies this assay can detect is 250 copies / mL. A negative result does not preclude SARS-CoV-2 infection and should not be used as the sole basis for treatment or other patient management decisions.  A negative result may occur with improper specimen collection / handling, submission of specimen other than nasopharyngeal swab, presence of viral mutation(s) within the areas targeted by this assay, and inadequate number of viral copies (<250 copies / mL). A negative result must be combined with clinical observations, patient history, and epidemiological information.  Fact Sheet for Patients:   https://www.patel.info/  Fact Sheet for Healthcare Providers: https://hall.com/  This test is not yet approved or  cleared by the [place] FDA and has been authorized for detection and/or diagnosis of SARS-CoV-2 by FDA under an Emergency Use Authorization (EUA).  This EUA will remain in effect (meaning this test can be used) for the duration of the COVID-19 declaration under Section 564(b)(1) of the Act, 21 U.S.C. section 360bbb-3(b)(1), unless the authorization is terminated or revoked sooner.  Performed at [facility], [place] [address] [place], [place] [address]   Respiratory (~20 pathogens) panel by PCR     Status: None    Collection Time: 03/14/22  7:51 PM   Specimen: Nasopharyngeal Swab; Respiratory  Result Value Ref Range Status   Adenovirus NOT DETECTED NOT DETECTED Final   Coronavirus 229E NOT DETECTED NOT DETECTED Final    Comment: (NOTE) The Coronavirus on the Respiratory Panel, DOES NOT test for the novel  Coronavirus (2019 nCoV)    Coronavirus HKU1 NOT DETECTED NOT DETECTED Final   Coronavirus NL63 NOT DETECTED NOT DETECTED Final   Coronavirus OC43 NOT DETECTED NOT DETECTED Final   Metapneumovirus NOT DETECTED NOT DETECTED Final   Rhinovirus / Enterovirus NOT DETECTED NOT DETECTED Final   Influenza A NOT DETECTED NOT DETECTED Final   Influenza B NOT DETECTED NOT DETECTED Final   Parainfluenza Virus 1 NOT DETECTED NOT DETECTED Final   Parainfluenza Virus 2 NOT DETECTED NOT DETECTED Final   Parainfluenza Virus 3 NOT DETECTED NOT DETECTED Final   Parainfluenza Virus 4 NOT DETECTED NOT DETECTED Final   Respiratory Syncytial Virus NOT DETECTED NOT DETECTED Final   Bordetella pertussis NOT DETECTED NOT DETECTED Final   Bordetella Parapertussis NOT  DETECTED NOT DETECTED Final   Chlamydophila pneumoniae NOT DETECTED NOT DETECTED Final   Mycoplasma pneumoniae NOT DETECTED NOT DETECTED Final    Comment: Performed at [facility], [place] [address], [place], [place] [address]     Labs: BNP (last 3 results) No results for input(s): "BNP" in the last 8760 hours. Basic Metabolic Panel: Recent Labs  Lab 03/14/22 1218 03/15/22 0132  NA 142 142  K 3.2* 4.0  CL 106 109  CO2 26 25  GLUCOSE 122* 100*  BUN 13 8  CREATININE 0.81 0.58  CALCIUM 9.3 8.5*   Liver Function Tests: Recent Labs  Lab 03/14/22 1218  AST 24  ALT 26  ALKPHOS 49  BILITOT 0.8  PROT 7.5  ALBUMIN 4.0   Recent Labs  Lab 03/14/22 1218  LIPASE 38   No results for input(s): "AMMONIA" in the last 168 hours. CBC: Recent Labs  Lab 03/14/22 1218 03/14/22 1746 03/15/22 0123 03/15/22 0905 03/15/22 1718 03/16/22 0141  03/16/22 0858  WBC 7.9  --  5.8  --   --  5.5  --   NEUTROABS 5.2  --   --   --   --   --   --   HGB 13.7   < > 12.0 12.3 12.1 11.5* 12.6  HCT 41.5   < > 36.8 37.8 37.9 35.1* 38.3  MCV 96.5  --  97.4  --   --  97.8  --   PLT 239  --  191  --   --  187  --    < > = values in this interval not displayed.   Cardiac Enzymes: No results for input(s): "CKTOTAL", "CKMB", "CKMBINDEX", "TROPONINI" in the last 168 hours. BNP: Invalid input(s): "POCBNP" CBG: No results for input(s): "GLUCAP" in the last 168 hours. D-Dimer No results for input(s): "DDIMER" in the last 72 hours. Hgb A1c No results for input(s): "HGBA1C" in the last 72 hours. Lipid Profile No results for input(s): "CHOL", "HDL", "LDLCALC", "TRIG", "CHOLHDL", "LDLDIRECT" in the last 72 hours. Thyroid function studies No results for input(s): "TSH", "T4TOTAL", "T3FREE", "THYROIDAB" in the last 72 hours.  Invalid input(s): "FREET3" Anemia work up No results for input(s): "VITAMINB12", "FOLATE", "FERRITIN", "TIBC", "IRON", "RETICCTPCT" in the last 72 hours. Urinalysis    Component Value Date/Time   COLORURINE YELLOW 03/14/2022 1219   APPEARANCEUR HAZY (A) 03/14/2022 1219   LABSPEC 1.020 03/14/2022 1219   PHURINE 6.5 03/14/2022 1219   GLUCOSEU NEGATIVE 03/14/2022 1219   HGBUR MODERATE (A) 03/14/2022 1219   BILIRUBINUR NEGATIVE 03/14/2022 1219   BILIRUBINUR NEG 04/24/2015 1034   KETONESUR NEGATIVE 03/14/2022 1219   PROTEINUR 30 (A) 03/14/2022 1219   UROBILINOGEN 0.2 04/24/2015 1034   NITRITE NEGATIVE 03/14/2022 1219   LEUKOCYTESUR SMALL (A) 03/14/2022 1219   Sepsis Labs Recent Labs  Lab 03/14/22 1218 03/15/22 0123 03/16/22 0141  WBC 7.9 5.8 5.5   Microbiology Recent Results (from the past 240 hour(s))  SARS Coronavirus 2 by RT PCR (hospital order, performed in [place] hospital lab) *cepheid single result test* Anterior Nasal Swab     Status: None   Collection Time: 03/14/22  7:51 PM   Specimen: Anterior Nasal  Swab  Result Value Ref Range Status   SARS Coronavirus 2 by RT PCR NEGATIVE NEGATIVE Final    Comment: (NOTE) SARS-CoV-2 target nucleic acids are NOT DETECTED.  The SARS-CoV-2 RNA is generally detectable in upper and lower respiratory specimens during the acute phase of infection. The  lowest concentration of SARS-CoV-2 viral copies this assay can detect is 250 copies / mL. A negative result does not preclude SARS-CoV-2 infection and should not be used as the sole basis for treatment or other patient management decisions.  A negative result may occur with improper specimen collection / handling, submission of specimen other than nasopharyngeal swab, presence of viral mutation(s) within the areas targeted by this assay, and inadequate number of viral copies (<250 copies / mL). A negative result must be combined with clinical observations, patient history, and epidemiological information.  Fact Sheet for Patients:   https://www.patel.info/  Fact Sheet for Healthcare Providers: https://hall.com/  This test is not yet approved or  cleared by the [place] FDA and has been authorized for detection and/or diagnosis of SARS-CoV-2 by FDA under an Emergency Use Authorization (EUA).  This EUA will remain in effect (meaning this test can be used) for the duration of the COVID-19 declaration under Section 564(b)(1) of the Act, 21 U.S.C. section 360bbb-3(b)(1), unless the authorization is terminated or revoked sooner.  Performed at [facility], [place] [address] [place], [place] [address]   Respiratory (~20 pathogens) panel by PCR     Status: None   Collection Time: 03/14/22  7:51 PM   Specimen: Nasopharyngeal Swab; Respiratory  Result Value Ref Range Status   Adenovirus NOT DETECTED NOT DETECTED Final   Coronavirus 229E NOT DETECTED NOT DETECTED Final    Comment: (NOTE) The Coronavirus on the Respiratory Panel, DOES NOT test  for the novel  Coronavirus (2019 nCoV)    Coronavirus HKU1 NOT DETECTED NOT DETECTED Final   Coronavirus NL63 NOT DETECTED NOT DETECTED Final   Coronavirus OC43 NOT DETECTED NOT DETECTED Final   Metapneumovirus NOT DETECTED NOT DETECTED Final   Rhinovirus / Enterovirus NOT DETECTED NOT DETECTED Final   Influenza A NOT DETECTED NOT DETECTED Final   Influenza B NOT DETECTED NOT DETECTED Final   Parainfluenza Virus 1 NOT DETECTED NOT DETECTED Final   Parainfluenza Virus 2 NOT DETECTED NOT DETECTED Final   Parainfluenza Virus 3 NOT DETECTED NOT DETECTED Final   Parainfluenza Virus 4 NOT DETECTED NOT DETECTED Final   Respiratory Syncytial Virus NOT DETECTED NOT DETECTED Final   Bordetella pertussis NOT DETECTED NOT DETECTED Final   Bordetella Parapertussis NOT DETECTED NOT DETECTED Final   Chlamydophila pneumoniae NOT DETECTED NOT DETECTED Final   Mycoplasma pneumoniae NOT DETECTED NOT DETECTED Final    Comment: Performed at [facility], [place]. [address] [place], [place] [address]     Patient was seen and examined on the day of discharge and was found to be in stable condition. Time coordinating discharge: 40 minutes including assessment and coordination of care, as well as examination of the patient.   SIGNED:  Dessa Phi, DO [facility] 03/16/2022, 3:44 PM

## 2022-03-16 NOTE — Progress Notes (Addendum)
[facility] Progress Note  CC:  Rectal bleeding   Subjective: Feels fine.  No abdominal pain, diarrhea, nausea or vomiting.  No further rectal bleeding.  Tolerating full liquids and diet has been changed to soft this afternoon.  Objective:  Vital signs in last 24 hours: Temp:  [98 F (36.7 C)-98.5 F (36.9 C)] 98.4 F (36.9 C) (12/05 0409) Pulse Rate:  [64-92] 64 (12/05 0409) Resp:  [16-18] 17 (12/05 0409) BP: (101-122)/(56-63) 101/63 (12/05 0409) SpO2:  [92 %-96 %] 92 % (12/05 0409) Last BM Date : 03/14/22 General:  Alert, Well-developed, in NAD Heart:  Regular rate and rhythm; no murmurs Pulm:  CTAB.  No W/R/R. Abdomen:  Soft, non-distended.  BS present.  Non-tender. Extremities:  Without edema. Neurologic:  Alert and oriented x 4;  grossly normal neurologically. Psych:  Alert and cooperative. Normal mood and affect.  Intake/Output from previous day: 12/04 0701 - 12/05 0700 In: 4271.1 [P.O.:1833; I.V.:1483.9; IV Piggyback:954.3] Out: 3 [Urine:2; Emesis/NG output:1]  Lab Results: Recent Labs    03/14/22 1218 03/14/22 1746 03/15/22 0123 03/15/22 0905 03/15/22 1718 03/16/22 0141 03/16/22 0858  WBC 7.9  --  5.8  --   --  5.5  --   HGB 13.7   < > 12.0   < > 12.1 11.5* 12.6  HCT 41.5   < > 36.8   < > 37.9 35.1* 38.3  PLT 239  --  191  --   --  187  --    < > = values in this interval not displayed.   BMET Recent Labs    03/14/22 1218 03/15/22 0132  NA 142 142  K 3.2* 4.0  CL 106 109  CO2 26 25  GLUCOSE 122* 100*  BUN 13 8  CREATININE 0.81 0.58  CALCIUM 9.3 8.5*   LFT Recent Labs    03/14/22 1218  PROT 7.5  ALBUMIN 4.0  AST 24  ALT 26  ALKPHOS 49  BILITOT 0.8   CT ABDOMEN PELVIS W CONTRAST  Result Date: 03/14/2022 CLINICAL DATA:  Left lower quadrant pain. Rectal bleeding. Suspected diverticulitis. EXAM: CT ABDOMEN AND PELVIS WITH CONTRAST TECHNIQUE: Multidetector CT imaging of the abdomen and pelvis was performed using the  standard protocol following bolus administration of intravenous contrast. RADIATION DOSE REDUCTION: This exam was performed according to the departmental dose-optimization program which includes automated exposure control, adjustment of the mA and/or kV according to patient size and/or use of iterative reconstruction technique. CONTRAST:  114m OMNIPAQUE IOHEXOL 300 MG/ML  SOLN COMPARISON:  11/04/2015 FINDINGS: Lower Chest: No acute findings. Hepatobiliary: No hepatic masses identified. Tiny calcified gallstone is again noted, however there is no evidence of cholecystitis or biliary ductal dilatation. Pancreas:  No mass or inflammatory changes. Spleen: Within normal limits in size and appearance. Adrenals/Urinary Tract: No suspicious masses identified. 3 mm calculus noted in lower pole of left kidney. No evidence of ureteral calculi or hydronephrosis. Unremarkable unopacified urinary bladder. Stomach/Bowel: Normal appendix visualized. Mild wall thickening is seen involving the distal descending colon mild pericolonic soft tissue stranding. There is no evidence of diverticular disease in this area. No evidence of perforation, abscess, or bowel obstruction. Vascular/Lymphatic: No pathologically enlarged lymph nodes. No acute vascular findings. Aortic atherosclerotic calcification incidentally noted. Reproductive: Prior hysterectomy noted. Adnexal regions are unremarkable in appearance. Other:  None. Musculoskeletal:  No suspicious bone lesions identified. IMPRESSION: Mild nonspecific colitis involving the distal descending colon. No evidence of diverticular disease, perforation, or abscess. Cholelithiasis. No  radiographic evidence of cholecystitis. Tiny left renal calculus. No evidence of ureteral calculi or hydronephrosis. Aortic Atherosclerosis (ICD10-I70.0). Electronically Signed   By: Marlaine Hind M.D.   On: 03/14/2022 15:08    Assessment / Plan: # Rectal bleeding, lower abdominal discomfort and mild distal  descending colon colitis on CT scan . Etiology unclear.  I would have expected some diarrhea if  she had infectious colitis but she adamantly denies any recent diarrhea. Diverticular hemorrhage doesn't fit. Ischemic colitis?  Normal WBC. Hgb normal/stable. Tolerating full liquids.  Has not had soft diet yet.  I think that she is ok for discharge later this afternoon. No further antibiotics at discharge.   # See PMH for additional medical problems    LOS: 1 day   Theresa Duran. Zehr  03/16/2022, 10:47 AM   GI attending:  I think she has had a mild episode of non-occlusve acute ischemic colitis. She is ready for dc I will arrange a f/u appointment with [place]  Gatha Mayer, MD, [facility] See Shea Evans on call - gastroenterology for best contact person 03/16/2022 3:39 PM

## 2022-03-17 ENCOUNTER — Telehealth: Payer: Self-pay

## 2022-03-17 NOTE — Patient Outreach (Signed)
  Care Coordination TOC Note Transition Care Management Follow-up Telephone Call Date of discharge and from where: [facility] 03[facility]14[facility]22-03/16/22 How have you been since you were released from the hospital? Per daughter, patient is doing fine, she still has a cough" Any questions or concerns? No  Items Reviewed: Did the pt receive and understand the discharge instructions provided? Yes  Medications obtained and verified? No  Other? No  Any new allergies since your discharge? No  Dietary orders reviewed? Yes Do you have support at home? Yes   Home Care and Equipment/Supplies: Were home health services ordered? no If so, what is the name of the agency? N/a  Has the agency set up a time to come to the patient's home? no Were any new equipment or medical supplies ordered?  No What is the name of the medical supply agency? na Were you able to get the supplies/equipment? no Do you have any questions related to the use of the equipment or supplies? No  Functional Questionnaire: (I = Independent and D = Dependent) ADLs: I  Bathing/Dressing- I  Meal Prep- I  Eating- I  Maintaining continence- I  Transferring/Ambulation- I  Managing Meds- I  Follow up appointments reviewed:  PCP Hospital f/u appt confirmed? No  Patient has moved to [facility]Dr. Rowe Clack. Message sent to [facility] office mgr to notify. [facility] f/u appt confirmed? Yes  Scheduled to see Dr. Myrtice Lauth on 04/20/22 @ 0830. Are transportation arrangements needed? No  If their condition worsens, is the pt aware to call PCP or go to the Emergency Dept.? Yes Was the patient provided with contact information for the PCP's office or ED? Yes Was to pt encouraged to call back with questions or concerns? Yes  SDOH assessments and interventions completed:   Yes   Care Coordination Interventions:  PCP follow up appointment requested   Encounter Outcome:  Pt. Visit Completed

## 2022-03-17 NOTE — Telephone Encounter (Signed)
Patient transferred Transition Care Management Follow-up Telephone Call Date of discharge and from where: [facility]22 How have you been since you were released from the hospital? better Any questions or concerns? No  Items Reviewed: Did the pt receive and understand the discharge instructions provided? Yes  Medications obtained and verified? Yes  Other? No  Any new allergies since your discharge? No  Dietary orders reviewed? Yes Do you have support at home? Yes   Home Care and Equipment/Supplies: Were home health services ordered? no If so, what is the name of the agency? N/a  Has the agency set up a time to come to the patient's home? not applicable Were any new equipment or medical supplies ordered?  No What is the name of the medical supply agency? N/a Were you able to get the supplies/equipment? not applicable Do you have any questions related to the use of the equipment or supplies? No  Functional Questionnaire: (I = Independent and D = Dependent) ADLs: I  Bathing/Dressing- I  Meal Prep- I  Eating- I  Maintaining continence- I  Transferring/Ambulation- I  Managing Meds- I  Follow up appointments reviewed:  PCP Hospital f/u appt confirmed? No  Patient has transferred PCP [facility] f/u appt confirmed? Yes  Scheduled to see Alonza Bogus on 04/20/2022 @ 8:30. Are transportation arrangements needed? No  If their condition worsens, is the pt aware to call PCP or go to the Emergency Dept.? Yes Was the patient provided with contact information for the PCP's office or ED? Yes Was to pt encouraged to call back with questions or concerns? Yes Juanda Crumble, LPN [facility] [phone]

## 2022-03-19 DIAGNOSIS — R058 Other specified cough: Secondary | ICD-10-CM | POA: Diagnosis not present

## 2022-03-22 DIAGNOSIS — K559 Vascular disorder of intestine, unspecified: Secondary | ICD-10-CM | POA: Diagnosis not present

## 2022-03-23 DIAGNOSIS — H35373 Puckering of macula, bilateral: Secondary | ICD-10-CM | POA: Diagnosis not present

## 2022-03-31 ENCOUNTER — Ambulatory Visit: Payer: Medicare Other | Admitting: Gastroenterology

## 2022-04-20 ENCOUNTER — Ambulatory Visit (INDEPENDENT_AMBULATORY_CARE_PROVIDER_SITE_OTHER): Payer: Medicare Other | Admitting: Gastroenterology

## 2022-04-20 ENCOUNTER — Encounter: Payer: Self-pay | Admitting: Gastroenterology

## 2022-04-20 ENCOUNTER — Other Ambulatory Visit (INDEPENDENT_AMBULATORY_CARE_PROVIDER_SITE_OTHER): Payer: Medicare Other

## 2022-04-20 VITALS — BP 120/78 | HR 87 | Ht <= 58 in | Wt 151.0 lb

## 2022-04-20 DIAGNOSIS — R1013 Epigastric pain: Secondary | ICD-10-CM

## 2022-04-20 DIAGNOSIS — K219 Gastro-esophageal reflux disease without esophagitis: Secondary | ICD-10-CM

## 2022-04-20 LAB — H. PYLORI ANTIBODY, IGG: H Pylori IgG: NEGATIVE

## 2022-04-20 MED ORDER — OMEPRAZOLE 40 MG PO CPDR: 40.0000 mg | DELAYED_RELEASE_CAPSULE | Freq: Every day | ORAL | 5 refills | Status: DC

## 2022-04-20 NOTE — Patient Instructions (Signed)
_______________________________________________________  If you are age 76 or older, your body mass index should be between 23-30. Your Body mass index is 32.68 kg/m. If this is out of the aforementioned range listed, please consider follow up with your Primary Care Provider.  If you are age 54 or younger, your body mass index should be between 19-25. Your Body mass index is 32.68 kg/m. If this is out of the aformentioned range listed, please consider follow up with your Primary Care Provider.   ________________________________________________________  The [facility] providers would like to encourage you to use [facility] to communicate with providers for non-urgent requests or questions.  Due to long hold times on the telephone, sending your provider a message by [facility] may be a faster and more efficient way to get a response.  Please allow 48 business hours for a response.  Please remember that this is for non-urgent requests.  _______________________________________________________  Your provider has requested that you go to the basement level for lab work before leaving today. Press "B" on the elevator. The lab is located at the first door on the left as you exit the elevator.  We have sent the following medications to your pharmacy for you to pick up at your convenience: Omeprazole  It was a pleasure to see you today!  Thank you for trusting me with your gastrointestinal care!

## 2022-04-20 NOTE — Progress Notes (Signed)
04/20/2022 [name] [id number] September 21, 1945   HISTORY OF PRESENT ILLNESS: This is a 77 year old female who is a patient of Dr. Celesta Aver.  She is here for hospital follow-up.  She was admitted for rectal bleeding and lower abdominal discomfort with mild distal descending colitis seen on the CT scan back in December.  She was seen in consult by our service.  She quickly improved and was discharged home.  She is here for follow-up.  She says from that standpoint she is doing well.  She says that she has had some acid reflux issues recently and some upper abdominal discomfort.  Says that it hurts after eating.  No nausea or vomiting.  Would like to start some medication for acid reflux.  Patient is primarily [place] speaking so the entire visit was performed via interpreter/translator.  Past Medical History:  Diagnosis Date   Arthritis    HANDS , LEGS   Cataract    REMOVED BOTH EYES   Hyperlipidemia    Hypertension    Kidney stone    Personal history of colonic polyps-adenoma 05/14/2013   Past Surgical History:  Procedure Laterality Date   ABDOMINAL HYSTERECTOMY     total vaginal with BSO, secondary to prolapse   COLONOSCOPY      reports that she has never smoked. She has never used smokeless tobacco. She reports that she does not drink alcohol and does not use drugs. family history includes Cancer in her sister; Hypotension in her sister. Not on File    Outpatient Encounter Medications as of 04/20/2022  Medication Sig   alendronate (FOSAMAX) 70 MG tablet TAKE 1 TABLET BY MOUTH WEEKLY  WITH 8 OZ OF PLAIN WATER 30  MINUTES BEFORE FIRST FOOD, DRINK OR MEDS. STAY UPRIGHT FOR 30  MINS (Patient taking differently: Take 70 mg by mouth See admin instructions. TAKE 70 MG (1 TABLET) BY MOUTH ON SUNDAYS WITH 8 OZ OF PLAIN WATER 30 MINUTES BEFORE FIRST FOOD, DRINK OR MEDS. STAY UPRIGHT FOR 30  MINS.)   amLODipine (NORVASC) 5 MG tablet TAKE 1 TABLET BY MOUTH AT  BEDTIME (Patient taking  differently: Take 5 mg by mouth daily.)   benzonatate (TESSALON) 100 MG capsule Take 1 capsule (100 mg total) by mouth 2 (two) times daily as needed for cough.   Cholecalciferol (VITAMIN D3 PO) Take 1 capsule by mouth daily.   Dextromethorphan-guaiFENesin (DELSYM COUGH/CHEST CONGEST DM) 5-100 MG/5ML LIQD Take 10 mLs by mouth every 12 (twelve) hours as needed (for coughing and/or congestion).   Multiple Vitamins-Minerals (CENTRUM SILVER PO) Take 1 tablet by mouth daily with breakfast.   rosuvastatin (CRESTOR) 5 MG tablet TAKE 1 TABLET BY MOUTH DAILY (Patient taking differently: Take 5 mg by mouth at bedtime.)   No facility-administered encounter medications on file as of 04/20/2022.     REVIEW OF SYSTEMS  : All other systems reviewed and negative except where noted in the History of Present Illness.   PHYSICAL EXAM: BP 120/78   Pulse 87   Ht '4\' 9"'$  (1.448 m)   Wt 151 lb (68.5 kg)   BMI 32.68 kg/m  General: Well developed Asian female in no acute distress Head: Normocephalic and atraumatic Eyes:  Sclerae anicteric, conjunctiva pink. Ears: Normal auditory acuity Lungs: Clear throughout to auscultation; no W/R/R. Heart: Regular rate and rhythm; no M/R/G. Abdomen: Soft, non-distended.  BS present.  Non-tender. Musculoskeletal: Symmetrical with no gross deformities  Skin: No lesions on visible extremities Extremities: No edema  Neurological: Alert  oriented x 4, grossly non-focal Psychological:  Alert and cooperative. Normal mood and affect  ASSESSMENT AND PLAN: *Rectal bleeding and lower abdominal discomfort with mild distal descending colitis seen on CT scan in the hospital last month.  Symptoms completely resolved.  Thought to be mild episode of nonocclusive acute ischemic colitis. *GERD and burning upper abdominal pain: Would like to start on some acid reflux medication.  Will start omeprazole 40 mg daily.   Prescription sent to pharmacy. Will check for H. pylori with serology as well.   If positive will treat.  Does not recall ever being tested or treated in the past.   CC:  Alcus Dad, MD

## 2022-05-05 DIAGNOSIS — R0981 Nasal congestion: Secondary | ICD-10-CM | POA: Diagnosis not present

## 2022-05-05 DIAGNOSIS — E785 Hyperlipidemia, unspecified: Secondary | ICD-10-CM | POA: Diagnosis not present

## 2022-05-05 DIAGNOSIS — Z1159 Encounter for screening for other viral diseases: Secondary | ICD-10-CM | POA: Diagnosis not present

## 2022-05-05 DIAGNOSIS — Z23 Encounter for immunization: Secondary | ICD-10-CM | POA: Diagnosis not present

## 2022-05-05 DIAGNOSIS — Z0000 Encounter for general adult medical examination without abnormal findings: Secondary | ICD-10-CM | POA: Diagnosis not present

## 2022-05-05 DIAGNOSIS — M810 Age-related osteoporosis without current pathological fracture: Secondary | ICD-10-CM | POA: Diagnosis not present

## 2022-05-05 DIAGNOSIS — M159 Polyosteoarthritis, unspecified: Secondary | ICD-10-CM | POA: Diagnosis not present

## 2022-05-05 DIAGNOSIS — I10 Essential (primary) hypertension: Secondary | ICD-10-CM | POA: Diagnosis not present

## 2022-06-10 ENCOUNTER — Ambulatory Visit (INDEPENDENT_AMBULATORY_CARE_PROVIDER_SITE_OTHER): Payer: 59 | Admitting: Gastroenterology

## 2022-06-10 ENCOUNTER — Encounter: Payer: Self-pay | Admitting: Gastroenterology

## 2022-06-10 VITALS — BP 118/68 | HR 95 | Wt 153.0 lb

## 2022-06-10 DIAGNOSIS — K219 Gastro-esophageal reflux disease without esophagitis: Secondary | ICD-10-CM

## 2022-06-10 DIAGNOSIS — R1013 Epigastric pain: Secondary | ICD-10-CM

## 2022-06-10 NOTE — Progress Notes (Signed)
     06/10/2022 [name] [id number] 1946/03/14   History of Present Illness: This is a 77 year old female who is a patient of Dr. Celesta Aver.  She is here for follow-up of her epigastric abdominal burning and reflux.  I saw her on 04/20/2022 at which time I started omeprazole 40 mg daily.  She has been taking that every day and her reflux and upper abdominal pain have resolved.  She has no new complaints today.  She does mention feeling dizzy with certain positional changes, lying on her left side, etc.  Will address this with her PCP.  Patient is primarily [place] speaking so the entire visit was performed via interpreter/translator.  Current Medications, Allergies, Past Medical History, Past Surgical History, Family History and Social History were reviewed in [employer] record.   Physical Exam: BP 118/68   Pulse 95   Wt 153 lb (69.4 kg)   SpO2 95%   BMI 33.11 kg/m  General: Well developed Asian female in no acute distress Head: Normocephalic and atraumatic Eyes:  Sclerae anicteric, conjunctiva pink  Ears: Normal auditory acuity Lungs: Clear throughout to auscultation; no W/R/R. Heart: Regular rate and rhythm; no M/R/G. Abdomen: Soft, non-distended.  BS present.  Non-tender. Musculoskeletal: Symmetrical with no gross deformities  Extremities: No edema  Neurological: Alert oriented x 4, grossly non-focal Psychological:  Alert and cooperative. Normal mood and affect  Assessment and Recommendations: *GERD and upper abdominal burning pain: Resolved on omeprazole 40 mg daily.  Will continue that for now.  H. pylori was negative.  Will have her follow-up in 6 months at which time we can determine if we want to continue this ongoingly or try to back down on the dosing, discontinue for a while, etc.

## 2022-06-10 NOTE — Patient Instructions (Addendum)
Follow up in 6 months or sooner if needed.  _______________________________________________________  If your blood pressure at your visit was 140/90 or greater, please contact your primary care physician to follow up on this.  _______________________________________________________  If you are age 76 or older, your body mass index should be between 23-30. Your Body mass index is 33.11 kg/m. If this is out of the aforementioned range listed, please consider follow up with your Primary Care Provider.  If you are age 65 or younger, your body mass index should be between 19-25. Your Body mass index is 33.11 kg/m. If this is out of the aformentioned range listed, please consider follow up with your Primary Care Provider.   ________________________________________________________  The [facility] providers would like to encourage you to use [facility] to communicate with providers for non-urgent requests or questions.  Due to long hold times on the telephone, sending your provider a message by [facility] may be a faster and more efficient way to get a response.  Please allow 48 business hours for a response.  Please remember that this is for non-urgent requests.  _______________________________________________________

## 2022-06-15 DIAGNOSIS — H442D3 Degenerative myopia with foveoschisis, bilateral eye: Secondary | ICD-10-CM | POA: Diagnosis not present

## 2022-06-15 DIAGNOSIS — H35373 Puckering of macula, bilateral: Secondary | ICD-10-CM | POA: Diagnosis not present

## 2022-06-15 DIAGNOSIS — Z961 Presence of intraocular lens: Secondary | ICD-10-CM | POA: Diagnosis not present

## 2022-08-21 ENCOUNTER — Other Ambulatory Visit: Payer: Self-pay | Admitting: Family Medicine

## 2022-08-21 DIAGNOSIS — E785 Hyperlipidemia, unspecified: Secondary | ICD-10-CM

## 2022-10-07 ENCOUNTER — Other Ambulatory Visit: Payer: Self-pay | Admitting: Family Medicine

## 2022-10-07 DIAGNOSIS — M810 Age-related osteoporosis without current pathological fracture: Secondary | ICD-10-CM

## 2022-11-01 ENCOUNTER — Other Ambulatory Visit: Payer: Self-pay | Admitting: Gastroenterology

## 2022-11-03 DIAGNOSIS — R7303 Prediabetes: Secondary | ICD-10-CM | POA: Diagnosis not present

## 2022-11-03 DIAGNOSIS — T753XXA Motion sickness, initial encounter: Secondary | ICD-10-CM | POA: Diagnosis not present

## 2022-11-03 DIAGNOSIS — M47816 Spondylosis without myelopathy or radiculopathy, lumbar region: Secondary | ICD-10-CM | POA: Diagnosis not present

## 2023-01-04 DIAGNOSIS — H35343 Macular cyst, hole, or pseudohole, bilateral: Secondary | ICD-10-CM | POA: Diagnosis not present

## 2023-01-04 DIAGNOSIS — H35373 Puckering of macula, bilateral: Secondary | ICD-10-CM | POA: Diagnosis not present

## 2023-02-02 DIAGNOSIS — E785 Hyperlipidemia, unspecified: Secondary | ICD-10-CM | POA: Diagnosis not present

## 2023-02-02 DIAGNOSIS — I10 Essential (primary) hypertension: Secondary | ICD-10-CM | POA: Diagnosis not present

## 2023-02-02 DIAGNOSIS — F5101 Primary insomnia: Secondary | ICD-10-CM | POA: Diagnosis not present

## 2023-02-02 DIAGNOSIS — Z0000 Encounter for general adult medical examination without abnormal findings: Secondary | ICD-10-CM | POA: Diagnosis not present

## 2023-02-02 DIAGNOSIS — Z23 Encounter for immunization: Secondary | ICD-10-CM | POA: Diagnosis not present

## 2023-04-21 DIAGNOSIS — E785 Hyperlipidemia, unspecified: Secondary | ICD-10-CM | POA: Diagnosis not present

## 2023-04-21 DIAGNOSIS — I10 Essential (primary) hypertension: Secondary | ICD-10-CM | POA: Diagnosis not present

## 2023-04-21 DIAGNOSIS — R051 Acute cough: Secondary | ICD-10-CM | POA: Diagnosis not present

## 2023-04-21 DIAGNOSIS — J208 Acute bronchitis due to other specified organisms: Secondary | ICD-10-CM | POA: Diagnosis not present

## 2023-05-30 ENCOUNTER — Other Ambulatory Visit: Payer: Self-pay | Admitting: Gastroenterology

## 2023-08-03 DIAGNOSIS — I10 Essential (primary) hypertension: Secondary | ICD-10-CM | POA: Diagnosis not present

## 2023-08-03 DIAGNOSIS — R7303 Prediabetes: Secondary | ICD-10-CM | POA: Diagnosis not present

## 2023-08-03 DIAGNOSIS — M810 Age-related osteoporosis without current pathological fracture: Secondary | ICD-10-CM | POA: Diagnosis not present

## 2023-08-03 DIAGNOSIS — E785 Hyperlipidemia, unspecified: Secondary | ICD-10-CM | POA: Diagnosis not present

## 2023-08-03 DIAGNOSIS — G8929 Other chronic pain: Secondary | ICD-10-CM | POA: Diagnosis not present

## 2023-08-03 DIAGNOSIS — M5459 Other low back pain: Secondary | ICD-10-CM | POA: Diagnosis not present

## 2023-08-03 DIAGNOSIS — R350 Frequency of micturition: Secondary | ICD-10-CM | POA: Diagnosis not present

## 2023-09-12 DIAGNOSIS — I10 Essential (primary) hypertension: Secondary | ICD-10-CM | POA: Diagnosis not present

## 2023-09-12 DIAGNOSIS — R7303 Prediabetes: Secondary | ICD-10-CM | POA: Diagnosis not present

## 2023-09-12 DIAGNOSIS — M5459 Other low back pain: Secondary | ICD-10-CM | POA: Diagnosis not present

## 2023-09-12 DIAGNOSIS — R944 Abnormal results of kidney function studies: Secondary | ICD-10-CM | POA: Diagnosis not present

## 2023-09-12 DIAGNOSIS — E785 Hyperlipidemia, unspecified: Secondary | ICD-10-CM | POA: Diagnosis not present

## 2024-01-10 DIAGNOSIS — Z961 Presence of intraocular lens: Secondary | ICD-10-CM | POA: Diagnosis not present

## 2024-01-10 DIAGNOSIS — H43812 Vitreous degeneration, left eye: Secondary | ICD-10-CM | POA: Diagnosis not present

## 2024-01-10 DIAGNOSIS — H442B3 Degenerative myopia with macular hole, bilateral eye: Secondary | ICD-10-CM | POA: Diagnosis not present

## 2024-01-10 DIAGNOSIS — H35343 Macular cyst, hole, or pseudohole, bilateral: Secondary | ICD-10-CM | POA: Diagnosis not present

## 2024-01-10 DIAGNOSIS — H35373 Puckering of macula, bilateral: Secondary | ICD-10-CM | POA: Diagnosis not present

## 2024-01-18 DIAGNOSIS — E785 Hyperlipidemia, unspecified: Secondary | ICD-10-CM | POA: Diagnosis not present

## 2024-01-18 DIAGNOSIS — R7303 Prediabetes: Secondary | ICD-10-CM | POA: Diagnosis not present

## 2024-01-18 DIAGNOSIS — I10 Essential (primary) hypertension: Secondary | ICD-10-CM | POA: Diagnosis not present

## 2024-01-19 ENCOUNTER — Telehealth: Payer: Self-pay | Admitting: Gastroenterology

## 2024-01-19 NOTE — Telephone Encounter (Signed)
 Inbound fax from [facility] requesting a change in medication for this patient. [facility] is requesting the change of her current prescription of omeprazole  to Omeprazole  DR cap. Please advise.

## 2024-01-20 MED ORDER — OMEPRAZOLE 40 MG PO CPDR: 40.0000 mg | DELAYED_RELEASE_CAPSULE | Freq: Every day | ORAL | 3 refills | Status: AC

## 2024-01-20 NOTE — Telephone Encounter (Signed)
Refill sent to [facility].

## 2024-01-24 DIAGNOSIS — Z0000 Encounter for general adult medical examination without abnormal findings: Secondary | ICD-10-CM | POA: Diagnosis not present

## 2024-01-24 DIAGNOSIS — Z23 Encounter for immunization: Secondary | ICD-10-CM | POA: Diagnosis not present

## 2024-01-24 DIAGNOSIS — I10 Essential (primary) hypertension: Secondary | ICD-10-CM | POA: Diagnosis not present

## 2024-01-24 DIAGNOSIS — K219 Gastro-esophageal reflux disease without esophagitis: Secondary | ICD-10-CM | POA: Diagnosis not present

## 2024-01-24 DIAGNOSIS — E785 Hyperlipidemia, unspecified: Secondary | ICD-10-CM | POA: Diagnosis not present

## 2024-02-06 DIAGNOSIS — Z23 Encounter for immunization: Secondary | ICD-10-CM | POA: Diagnosis not present
